# Patient Record
Sex: Female | Born: 1945
Health system: Southern US, Community
[De-identification: ages and names within clinical notes are randomized; demographics above are authoritative.]

## PROBLEM LIST (undated history)

## (undated) DIAGNOSIS — I10 Essential (primary) hypertension: Secondary | ICD-10-CM

## (undated) DIAGNOSIS — F419 Anxiety disorder, unspecified: Secondary | ICD-10-CM

## (undated) DIAGNOSIS — Z8719 Personal history of other diseases of the digestive system: Secondary | ICD-10-CM

## (undated) DIAGNOSIS — M719 Bursopathy, unspecified: Secondary | ICD-10-CM

## (undated) DIAGNOSIS — T7840XA Allergy, unspecified, initial encounter: Secondary | ICD-10-CM

## (undated) DIAGNOSIS — E7800 Pure hypercholesterolemia, unspecified: Secondary | ICD-10-CM

## (undated) DIAGNOSIS — H269 Unspecified cataract: Secondary | ICD-10-CM

## (undated) DIAGNOSIS — Z8619 Personal history of other infectious and parasitic diseases: Secondary | ICD-10-CM

## (undated) DIAGNOSIS — D649 Anemia, unspecified: Secondary | ICD-10-CM

## (undated) HISTORY — DX: Allergy, unspecified, initial encounter: T78.40XA

## (undated) HISTORY — DX: Personal history of other diseases of the digestive system: Z87.19

## (undated) HISTORY — DX: Bursopathy, unspecified: M71.9

## (undated) HISTORY — PX: CHOLECYSTECTOMY: SHX55

## (undated) HISTORY — PX: ABDOMINAL HYSTERECTOMY: SHX81

## (undated) HISTORY — DX: Unspecified cataract: H26.9

## (undated) HISTORY — PX: KIDNEY SURGERY: SHX687

## (undated) HISTORY — DX: Anemia, unspecified: D64.9

## (undated) HISTORY — PX: OTHER SURGICAL HISTORY: SHX169

## (undated) HISTORY — DX: Pure hypercholesterolemia, unspecified: E78.00

## (undated) HISTORY — PX: TONSILLECTOMY: SUR1361

## (undated) HISTORY — DX: Anxiety disorder, unspecified: F41.9

## (undated) HISTORY — DX: Personal history of other infectious and parasitic diseases: Z86.19

## (undated) HISTORY — DX: Essential (primary) hypertension: I10

---

## 2008-01-01 ENCOUNTER — Ambulatory Visit (HOSPITAL_BASED_OUTPATIENT_CLINIC_OR_DEPARTMENT_OTHER): Admission: RE | Admit: 2008-01-01 | Discharge: 2008-01-01 | Payer: Self-pay | Admitting: Orthopedic Surgery

## 2011-04-25 NOTE — Op Note (Signed)
[name], Kelli Wagner                ACCOUNT NO.:  [id number]   MEDICAL RECORD NO.:  [id number]          PATIENT TYPE:  AMB   LOCATION:  DSC                          FACILITY:  [facility]   PHYSICIAN:  Mila Homer. Sherlean Foot, M.D. DATE OF BIRTH:  12/13/1945   DATE OF PROCEDURE:  01/01/2008  DATE OF DISCHARGE:                               OPERATIVE REPORT   SURGEON:  Mila Homer. Sherlean Foot, M.D.   ASSISTANT:  None.   ANESTHESIA:  General.   PREOPERATIVE DIAGNOSIS:  Left shoulder impingement syndrome.   POSTOPERATIVE DIAGNOSIS:  Shoulder impingement syndrome plus  osteoarthritis of the acromioclavicular joint plus labral tearing.   PROCEDURE:  Left shoulder arthroscopy with debridement of an anterior  labral tear, subacromial decompression and distal clavicle resection.   INDICATIONS FOR PROCEDURE:  The patient is a 64 year old black female  with failure of conservative measures for shoulder impingement syndrome,  shoulder OA and MRI evidence of tendinosis of the rotator cuff, probable  anterior labral tear and AC joint degeneration.  Informed consent was  obtained.   DESCRIPTION OF PROCEDURE:  The patient was laid supine and administered  general anesthesia, then placed in a beach-chair position.  The left  shoulder was prepped and draped in the usual sterile fashion.  Inferolateral and inferomedial portals were created with a #11 blade,  blunt trocar and cannula.  Diagnostic arthroscopy revealed minimal  chondromalacia, but a degenerative-type anterior labral tear with  impingement in the joint.  The Gator shaver was used through the  anterior portal to debride the labrum.  I then went into the subacromial  space from the posterior portal.  I then went with the shaver from the  lateral portal and performed a bursectomy.  I then used the ArthroCare  debridement wand to clean off the undersurface of the acromion and  distal clavicle and released the CA ligament.  I then used a 4-mm  cylindrical  bur to perform aggressive acromioplasty and distal clavicle  resection.  I then debrided the rotator cuff from the top side and there  was some partial tearing indeed.  I then lavaged and closed with 4-0  nylon sutures, dressed with Xeroform dressing sponges, 2-inch silk tape  and a simple sling.   COMPLICATIONS:  None.   DRAINS:  None.           ______________________________  Mila Homer. Sherlean Foot, M.D.    SDL/MEDQ  D:  01/01/2008  T:  01/01/2008  Job:  [id number]

## 2011-09-01 LAB — POCT HEMOGLOBIN-HEMACUE: Hemoglobin: 14.7

## 2014-04-30 HISTORY — PX: COLONOSCOPY: SHX174

## 2017-04-19 DIAGNOSIS — Z9181 History of falling: Secondary | ICD-10-CM | POA: Diagnosis not present

## 2017-04-19 DIAGNOSIS — D509 Iron deficiency anemia, unspecified: Secondary | ICD-10-CM | POA: Diagnosis not present

## 2017-04-19 DIAGNOSIS — Z79899 Other long term (current) drug therapy: Secondary | ICD-10-CM | POA: Diagnosis not present

## 2017-04-19 DIAGNOSIS — M8580 Other specified disorders of bone density and structure, unspecified site: Secondary | ICD-10-CM | POA: Diagnosis not present

## 2017-04-19 DIAGNOSIS — M859 Disorder of bone density and structure, unspecified: Secondary | ICD-10-CM | POA: Diagnosis not present

## 2017-04-19 DIAGNOSIS — Z1389 Encounter for screening for other disorder: Secondary | ICD-10-CM | POA: Diagnosis not present

## 2017-04-19 DIAGNOSIS — Z0000 Encounter for general adult medical examination without abnormal findings: Secondary | ICD-10-CM | POA: Diagnosis not present

## 2017-04-19 DIAGNOSIS — R8299 Other abnormal findings in urine: Secondary | ICD-10-CM | POA: Diagnosis not present

## 2017-04-19 DIAGNOSIS — E782 Mixed hyperlipidemia: Secondary | ICD-10-CM | POA: Diagnosis not present

## 2017-04-19 DIAGNOSIS — Z13820 Encounter for screening for osteoporosis: Secondary | ICD-10-CM | POA: Diagnosis not present

## 2017-04-19 DIAGNOSIS — Z1231 Encounter for screening mammogram for malignant neoplasm of breast: Secondary | ICD-10-CM | POA: Diagnosis not present

## 2017-05-01 DIAGNOSIS — Z1231 Encounter for screening mammogram for malignant neoplasm of breast: Secondary | ICD-10-CM | POA: Diagnosis not present

## 2017-05-14 DIAGNOSIS — Z90710 Acquired absence of both cervix and uterus: Secondary | ICD-10-CM | POA: Diagnosis not present

## 2017-05-14 DIAGNOSIS — Z6835 Body mass index (BMI) 35.0-35.9, adult: Secondary | ICD-10-CM | POA: Diagnosis not present

## 2017-05-14 DIAGNOSIS — E669 Obesity, unspecified: Secondary | ICD-10-CM | POA: Diagnosis not present

## 2017-05-14 DIAGNOSIS — E785 Hyperlipidemia, unspecified: Secondary | ICD-10-CM | POA: Diagnosis not present

## 2017-05-18 DIAGNOSIS — N6489 Other specified disorders of breast: Secondary | ICD-10-CM | POA: Diagnosis not present

## 2017-05-18 DIAGNOSIS — R922 Inconclusive mammogram: Secondary | ICD-10-CM | POA: Diagnosis not present

## 2017-05-18 DIAGNOSIS — R928 Other abnormal and inconclusive findings on diagnostic imaging of breast: Secondary | ICD-10-CM | POA: Diagnosis not present

## 2017-07-18 DIAGNOSIS — K091 Developmental (nonodontogenic) cysts of oral region: Secondary | ICD-10-CM | POA: Diagnosis not present

## 2017-09-28 DIAGNOSIS — Z23 Encounter for immunization: Secondary | ICD-10-CM | POA: Diagnosis not present

## 2017-09-28 DIAGNOSIS — R0789 Other chest pain: Secondary | ICD-10-CM | POA: Diagnosis not present

## 2017-09-28 DIAGNOSIS — Z6833 Body mass index (BMI) 33.0-33.9, adult: Secondary | ICD-10-CM | POA: Diagnosis not present

## 2017-09-28 DIAGNOSIS — Z1339 Encounter for screening examination for other mental health and behavioral disorders: Secondary | ICD-10-CM | POA: Diagnosis not present

## 2017-09-28 DIAGNOSIS — E663 Overweight: Secondary | ICD-10-CM | POA: Diagnosis not present

## 2017-10-03 DIAGNOSIS — R0789 Other chest pain: Secondary | ICD-10-CM | POA: Diagnosis not present

## 2018-06-24 DIAGNOSIS — Z1339 Encounter for screening examination for other mental health and behavioral disorders: Secondary | ICD-10-CM | POA: Diagnosis not present

## 2018-06-24 DIAGNOSIS — Z0000 Encounter for general adult medical examination without abnormal findings: Secondary | ICD-10-CM | POA: Diagnosis not present

## 2018-06-24 DIAGNOSIS — M7742 Metatarsalgia, left foot: Secondary | ICD-10-CM | POA: Diagnosis not present

## 2018-06-24 DIAGNOSIS — J309 Allergic rhinitis, unspecified: Secondary | ICD-10-CM | POA: Diagnosis not present

## 2018-06-24 DIAGNOSIS — Z6832 Body mass index (BMI) 32.0-32.9, adult: Secondary | ICD-10-CM | POA: Diagnosis not present

## 2018-06-24 DIAGNOSIS — Z1331 Encounter for screening for depression: Secondary | ICD-10-CM | POA: Diagnosis not present

## 2018-07-01 DIAGNOSIS — Z6832 Body mass index (BMI) 32.0-32.9, adult: Secondary | ICD-10-CM | POA: Diagnosis not present

## 2018-07-01 DIAGNOSIS — J309 Allergic rhinitis, unspecified: Secondary | ICD-10-CM | POA: Diagnosis not present

## 2018-08-01 DIAGNOSIS — M2021 Hallux rigidus, right foot: Secondary | ICD-10-CM | POA: Insufficient documentation

## 2018-11-12 ENCOUNTER — Encounter: Payer: Self-pay | Admitting: Gastroenterology

## 2018-11-20 ENCOUNTER — Encounter: Payer: Self-pay | Admitting: Gastroenterology

## 2018-11-22 ENCOUNTER — Encounter: Payer: Self-pay | Admitting: Gastroenterology

## 2018-11-22 ENCOUNTER — Ambulatory Visit: Payer: Self-pay | Admitting: Gastroenterology

## 2018-11-22 ENCOUNTER — Other Ambulatory Visit (INDEPENDENT_AMBULATORY_CARE_PROVIDER_SITE_OTHER): Payer: Medicare Other

## 2018-11-22 ENCOUNTER — Encounter (INDEPENDENT_AMBULATORY_CARE_PROVIDER_SITE_OTHER): Payer: Self-pay

## 2018-11-22 ENCOUNTER — Ambulatory Visit (INDEPENDENT_AMBULATORY_CARE_PROVIDER_SITE_OTHER): Payer: Medicare Other | Admitting: Gastroenterology

## 2018-11-22 VITALS — BP 132/68 | HR 66 | Ht 60.0 in | Wt 174.0 lb

## 2018-11-22 DIAGNOSIS — Z8719 Personal history of other diseases of the digestive system: Secondary | ICD-10-CM

## 2018-11-22 DIAGNOSIS — R1032 Left lower quadrant pain: Secondary | ICD-10-CM | POA: Diagnosis not present

## 2018-11-22 MED ORDER — DICYCLOMINE HCL 10 MG PO CAPS: 10.0000 mg | ORAL_CAPSULE | Freq: Two times a day (BID) | ORAL | 11 refills | Status: DC

## 2018-11-22 NOTE — Patient Instructions (Signed)
If you are age 71 or older, your body mass index should be between 23-30. Your Body mass index is 33.98 kg/m. If this is out of the aforementioned range listed, please consider follow up with your Primary Care Provider.  If you are age 76 or younger, your body mass index should be between 19-25. Your Body mass index is 33.98 kg/m. If this is out of the aformentioned range listed, please consider follow up with your Primary Care Provider.   We have sent the following medications to your pharmacy for you to pick up at your convenience: Bentyl  You have been scheduled for a CT scan of the abdomen and pelvis at [facility] are scheduled on 12/06/18 at [place] should arrive 15 minutes prior to your appointment time for registration. Please follow the written instructions below on the day of your exam:  WARNING: IF YOU ARE ALLERGIC TO IODINE/X-RAY DYE, PLEASE NOTIFY RADIOLOGY IMMEDIATELY AT [phone]! YOU WILL BE GIVEN A 13 HOUR PREMEDICATION PREP.  1) Do not eat or drink anything after 10am (4 hours prior to your test) 2) You have been given 2 bottles of oral contrast to drink. The solution may taste better if refrigerated, but do NOT add ice or any other liquid to this solution. Shake well before drinking.    Drink 1 bottle of contrast @ 8am (2 hours prior to your exam)  Drink 1 bottle of contrast @ 9am (1 hour prior to your exam)  You may take any medications as prescribed with a small amount of water, if necessary. If you take any of the following medications: METFORMIN, GLUCOPHAGE, GLUCOVANCE, AVANDAMET, RIOMET, FORTAMET, [place] MET, JANUMET, GLUMETZA or METAGLIP, you MAY be asked to HOLD this medication 48 hours AFTER the exam.  The purpose of you drinking the oral contrast is to aid in the visualization of your intestinal tract. The contrast solution may cause some diarrhea. Depending on your individual set of symptoms, you may also receive an intravenous injection of  x-ray contrast/dye. Plan on being at [facility] for 30 minutes or longer, depending on the type of exam you are having performed.  This test typically takes 30-45 minutes to complete.  If you have any questions regarding your exam or if you need to reschedule, you may call the CT department at [phone] between the hours of 8:00 am and 5:00 pm, Monday-Friday.  ________________________________________________________________________  Dennis Bast will be due for a recall colonoscopy in 04/2019. We will send you a reminder in the mail when it gets closer to that time.   Please go to the lab on the 2nd floor [address] before you leave the office today.    Thank you,  Dr. Jackquline Denmark

## 2018-11-22 NOTE — Progress Notes (Signed)
Chief Complaint: Abdominal pain  Referring Provider:  Myer Peer, MD      ASSESSMENT AND PLAN;   #1.  Episodic lower abdominal pain resolved after N/V.  #2. H/O diverticulitis s/p lap sigmoid resection 02/2011  #3.  History of colonic polyps (TA) 04/2014.  Plan: - Bentyl 10mg  po bid #30 - CT abdo/pelvis with p.o. and IV contrast. - Increase water intake - CBC, CMP, lipase and TSH today. - If still with problems, will consider EGD/colon. - Rpt routine colon 04/2019. Earlier, if still with problems.  HPI:    Kelli Wagner is a 72 y.o. female  Referred by Dr. Nicki Reaper With episodic lower abdominal crampy pain x 2 months Had 4 discrete episodes-first 1 after eating apple, second after herbal medicines, 3rd occurred after eating baked chicken and rice/gravy, 4th happened spontaneously All these episodes got better after she had nausea/vomiting He denies having any abdominal bloating, heartburn, regurgitation, odynophagia or dysphagia. No fever or chills She feels better in between the episodes No weight loss No significant diarrhea or constipation Occasionally does have pellet-like stools. No melena or hematochezia. Accompanied by her husband Past Medical History:  Diagnosis Date  . History of diverticulitis    with hemorrhage (recurrent)   . Hx of Clostridium difficile infection   . Hypercholesteremia     Past Surgical History:  Procedure Laterality Date  . ABDOMINAL HYSTERECTOMY    . CHOLECYSTECTOMY    . COLONOSCOPY  04/30/2014   Colonic polyps status post polypectomy. Otherwise normal postoperative colonoscopy.   Marland Kitchen KIDNEY SURGERY    . Laproscopic sigmoid colon resection with primary anastomosis    . TONSILLECTOMY      Family History  Problem Relation Age of Onset  . Colon cancer Neg Hx     Social History   Tobacco Use  . Smoking status: Never Smoker  . Smokeless tobacco: Never Used  Substance Use Topics  . Alcohol use: Never    Frequency: Never  .  Drug use: Not on file    Current Outpatient Medications  Medication Sig Dispense Refill  . aspirin 81 MG chewable tablet Chew by mouth daily.    Marland Kitchen azelastine (ASTELIN) 0.1 % nasal spray Place into both nostrils 2 (two) times daily. Use in each nostril as directed    . calcium-vitamin D (OSCAL WITH D) 500-200 MG-UNIT tablet Take 1 tablet by mouth.    . cholecalciferol (VITAMIN D3) 25 MCG (1000 UT) tablet Take 2,000 Units by mouth daily.    . ferrous sulfate 325 (65 FE) MG tablet Take 325 mg by mouth daily with breakfast.    . fexofenadine (ALLEGRA) 180 MG tablet Take 180 mg by mouth daily.    . Multiple Vitamins-Minerals (WOMENS 50+ MULTI VITAMIN/MIN PO) Take by mouth.    . omega-3 acid ethyl esters (LOVAZA) 1 g capsule Take by mouth 2 (two) times daily.    . vitamin C (ASCORBIC ACID) 500 MG tablet Take 500 mg by mouth daily.     No current facility-administered medications for this visit.     Not on File  Review of Systems:  Constitutional: Denies fever, chills, diaphoresis, appetite change and fatigue.  HEENT: Denies photophobia, eye pain, redness, hearing loss, ear pain, congestion, sore throat, rhinorrhea, sneezing, mouth sores, neck pain, neck stiffness and tinnitus.   Respiratory: Denies SOB, DOE, cough, chest tightness,  and wheezing.   Cardiovascular: Denies chest pain, palpitations and leg swelling.  Genitourinary: Denies dysuria, urgency, frequency, hematuria, flank pain  and difficulty urinating.  Musculoskeletal: Denies myalgias, back pain, joint swelling, arthralgias and gait problem.  Skin: No rash.  Neurological: Denies dizziness, seizures, syncope, weakness, light-headedness, numbness and headaches.  Hematological: Denies adenopathy. Easy bruising, personal or family bleeding history  Psychiatric/Behavioral: No anxiety or depression     Physical Exam:    BP 132/68   Pulse 66   Ht 5' (1.524 m)   Wt 174 lb (78.9 kg)   BMI 33.98 kg/m  Filed Weights   11/22/18  1348  Weight: 174 lb (78.9 kg)   Constitutional:  Well-developed, in no acute distress. Psychiatric: Normal mood and affect. Behavior is normal. HEENT: Pupils normal.  Conjunctivae are normal. No scleral icterus. Neck supple.  Cardiovascular: Normal rate, regular rhythm. No edema Pulmonary/chest: Effort normal and breath sounds normal. No wheezing, rales or rhonchi. Abdominal: Soft, nondistended.  Mild lower abdominal tenderness, no rebound, bowel sounds active throughout. There are no masses palpable. No hepatomegaly.  Rectal:  defered Neurological: Alert and oriented to person place and time. Skin: Skin is warm and dry. No rashes noted.    Carmell Austria, MD 11/22/2018, 2:02 PM  Cc: Myer Peer, MD

## 2018-11-23 LAB — CBC WITH DIFFERENTIAL/PLATELET
BASOS ABS: 19 {cells}/uL (ref 0–200)
Basophils Relative: 0.3 %
EOS PCT: 2.1 %
Eosinophils Absolute: 132 cells/uL (ref 15–500)
HCT: 40.7 % (ref 35.0–45.0)
Hemoglobin: 13.7 g/dL (ref 11.7–15.5)
Lymphs Abs: 1657 cells/uL (ref 850–3900)
MCH: 29.6 pg (ref 27.0–33.0)
MCHC: 33.7 g/dL (ref 32.0–36.0)
MCV: 87.9 fL (ref 80.0–100.0)
MONOS PCT: 11.1 %
MPV: 10.2 fL (ref 7.5–12.5)
NEUTROS PCT: 60.2 %
Neutro Abs: 3793 cells/uL (ref 1500–7800)
PLATELETS: 270 10*3/uL (ref 140–400)
RBC: 4.63 10*6/uL (ref 3.80–5.10)
RDW: 13.5 % (ref 11.0–15.0)
Total Lymphocyte: 26.3 %
WBC mixed population: 699 cells/uL (ref 200–950)
WBC: 6.3 10*3/uL (ref 3.8–10.8)

## 2018-11-23 LAB — COMPREHENSIVE METABOLIC PANEL
AG Ratio: 2.1 (calc) (ref 1.0–2.5)
ALT: 23 U/L (ref 6–29)
AST: 21 U/L (ref 10–35)
Albumin: 4.6 g/dL (ref 3.6–5.1)
Alkaline phosphatase (APISO): 72 U/L (ref 33–130)
BILIRUBIN TOTAL: 0.3 mg/dL (ref 0.2–1.2)
BUN: 18 mg/dL (ref 7–25)
CALCIUM: 10.1 mg/dL (ref 8.6–10.4)
CO2: 29 mmol/L (ref 20–32)
Chloride: 105 mmol/L (ref 98–110)
Creat: 0.77 mg/dL (ref 0.60–0.93)
Globulin: 2.2 g/dL (calc) (ref 1.9–3.7)
Glucose, Bld: 100 mg/dL — ABNORMAL HIGH (ref 65–99)
Potassium: 4.1 mmol/L (ref 3.5–5.3)
Sodium: 143 mmol/L (ref 135–146)
TOTAL PROTEIN: 6.8 g/dL (ref 6.1–8.1)

## 2018-11-23 LAB — TSH: TSH: 1.65 m[IU]/L (ref 0.40–4.50)

## 2018-11-23 LAB — LIPASE: LIPASE: 48 U/L (ref 7–60)

## 2018-12-06 ENCOUNTER — Ambulatory Visit (HOSPITAL_BASED_OUTPATIENT_CLINIC_OR_DEPARTMENT_OTHER): Admission: RE | Admit: 2018-12-06 | Payer: Medicare Other | Source: Ambulatory Visit

## 2018-12-24 ENCOUNTER — Telehealth: Payer: Self-pay | Admitting: Gastroenterology

## 2018-12-24 NOTE — Telephone Encounter (Signed)
Called and left a message on VM for patient -RN explained that the CT scan scheduled for tomorrow would be cancelled as requested by the patient-patient was then informed (via VM) that she would need to call back to the office to reschedule the CT as soon as possible, office number given [phone];

## 2018-12-25 ENCOUNTER — Ambulatory Visit (HOSPITAL_BASED_OUTPATIENT_CLINIC_OR_DEPARTMENT_OTHER): Admission: RE | Admit: 2018-12-25 | Payer: Medicare Other | Source: Ambulatory Visit

## 2019-01-03 ENCOUNTER — Encounter (HOSPITAL_BASED_OUTPATIENT_CLINIC_OR_DEPARTMENT_OTHER): Payer: Self-pay

## 2019-01-03 ENCOUNTER — Ambulatory Visit (HOSPITAL_BASED_OUTPATIENT_CLINIC_OR_DEPARTMENT_OTHER)
Admission: RE | Admit: 2019-01-03 | Discharge: 2019-01-03 | Disposition: A | Discharge status: Home / Self Care | Payer: Medicare Other | Source: Ambulatory Visit | Attending: Gastroenterology | Admitting: Gastroenterology

## 2019-01-03 DIAGNOSIS — R1032 Left lower quadrant pain: Secondary | ICD-10-CM | POA: Insufficient documentation

## 2019-01-03 DIAGNOSIS — Z8719 Personal history of other diseases of the digestive system: Secondary | ICD-10-CM | POA: Diagnosis present

## 2019-01-03 MED ORDER — IOPAMIDOL (ISOVUE-300) INJECTION 61%
100.0000 mL | Freq: Once | INTRAVENOUS | Status: AC | PRN
  Administered 2019-01-03: 100 mL via INTRAVENOUS

## 2019-05-02 ENCOUNTER — Ambulatory Visit (AMBULATORY_SURGERY_CENTER): Payer: Self-pay | Admitting: *Deleted

## 2019-05-02 ENCOUNTER — Other Ambulatory Visit: Payer: Self-pay

## 2019-05-02 VITALS — Ht 60.25 in | Wt 150.0 lb

## 2019-05-02 DIAGNOSIS — Z86010 Personal history of colonic polyps: Secondary | ICD-10-CM

## 2019-05-02 DIAGNOSIS — R1032 Left lower quadrant pain: Secondary | ICD-10-CM

## 2019-05-02 MED ORDER — NA SULFATE-K SULFATE-MG SULF 17.5-3.13-1.6 GM/177ML PO SOLN: 1.0000 | Freq: Once | ORAL | 0 refills | Status: AC

## 2019-05-02 NOTE — Progress Notes (Signed)
No egg or soy allergy known to patient  No issues with past sedation with any surgeries  or procedures, no intubation problems - with gall bladder hypotension due to increased sedation  No diet pills per patient No home 02 use per patient  No blood thinners per patient  Pt denies issues with constipation  No A fib or A flutter  EMMI video sent to pt's e mail    Pt mailed instruction packet to included paper to complete and mail back to [facility] with addressed and stamped envelope, Emmi video, copy of consent form to read and not return, and instructions. PV completed over the phone. Pt encouraged to call with questions or issues

## 2019-05-14 ENCOUNTER — Telehealth: Payer: Self-pay | Admitting: *Deleted

## 2019-05-14 NOTE — Telephone Encounter (Signed)

## 2019-05-16 ENCOUNTER — Telehealth: Payer: Self-pay | Admitting: Gastroenterology

## 2019-05-16 ENCOUNTER — Encounter: Payer: Medicare Other | Admitting: Gastroenterology

## 2019-05-16 NOTE — Telephone Encounter (Signed)
Discussed with Dr. Lyndel Safe. Offered endo today or reschedule ECL for another date. Rescheduled pt to 05/22/19 at 100 pm, reviewed instructions with patient, states understanding.

## 2019-05-20 ENCOUNTER — Telehealth: Payer: Self-pay | Admitting: *Deleted

## 2019-05-20 NOTE — Telephone Encounter (Signed)

## 2019-05-22 ENCOUNTER — Other Ambulatory Visit: Payer: Self-pay

## 2019-05-22 ENCOUNTER — Ambulatory Visit (AMBULATORY_SURGERY_CENTER): Payer: Medicare Other | Admitting: Gastroenterology

## 2019-05-22 ENCOUNTER — Encounter: Payer: Self-pay | Admitting: Gastroenterology

## 2019-05-22 VITALS — BP 118/68 | HR 69 | Temp 98.5°F | Resp 13 | Ht 60.0 in | Wt 174.0 lb

## 2019-05-22 DIAGNOSIS — K449 Diaphragmatic hernia without obstruction or gangrene: Secondary | ICD-10-CM

## 2019-05-22 DIAGNOSIS — Z86010 Personal history of colonic polyps: Secondary | ICD-10-CM

## 2019-05-22 DIAGNOSIS — D124 Benign neoplasm of descending colon: Secondary | ICD-10-CM

## 2019-05-22 DIAGNOSIS — K2970 Gastritis, unspecified, without bleeding: Secondary | ICD-10-CM | POA: Diagnosis not present

## 2019-05-22 MED ORDER — OMEPRAZOLE 20 MG PO CPDR: 20.0000 mg | DELAYED_RELEASE_CAPSULE | Freq: Every day | ORAL | 0 refills | Status: DC

## 2019-05-22 MED ORDER — SODIUM CHLORIDE 0.9 % IV SOLN: 500.0000 mL | INTRAVENOUS | Status: DC

## 2019-05-22 NOTE — Progress Notes (Signed)
Report to PACU, RN, vss, BBS= Clear.  

## 2019-05-22 NOTE — Op Note (Signed)
[facility] Patient Name: Kelli Wagner Procedure Date: 05/22/2019 12:15 PM MRN: [id number] Endoscopist: Jackquline Denmark , MD Age: 73 Referring MD:  Date of Birth: 08/11/1946 Gender: Female Account #: [id number] Procedure:                Upper GI endoscopy Indications:              Epigastric abdominal pain Medicines:                Monitored Anesthesia Care Procedure:                Pre-Anesthesia Assessment:                           - Prior to the procedure, a History and Physical                            was performed, and patient medications and                            allergies were reviewed. The patient's tolerance of                            previous anesthesia was also reviewed. The risks                            and benefits of the procedure and the sedation                            options and risks were discussed with the patient.                            All questions were answered, and informed consent                            was obtained. Prior Anticoagulants: The patient has                            taken no previous anticoagulant or antiplatelet                            agents. ASA Grade Assessment: II - A patient with                            mild systemic disease. After reviewing the risks                            and benefits, the patient was deemed in                            satisfactory condition to undergo the procedure.                           - Prior to the procedure, a History and Physical  was performed, and patient medications and                            allergies were reviewed. The patient's tolerance of                            previous anesthesia was also reviewed. The risks                            and benefits of the procedure and the sedation                            options and risks were discussed with the patient.                            All questions were answered, and  informed consent                            was obtained. Prior Anticoagulants: The patient has                            taken no previous anticoagulant or antiplatelet                            agents. ASA Grade Assessment: II - A patient with                            mild systemic disease. After reviewing the risks                            and benefits, the patient was deemed in                            satisfactory condition to undergo the procedure.                           After obtaining informed consent, the endoscope was                            passed under direct vision. Throughout the                            procedure, the patient's blood pressure, pulse, and                            oxygen saturations were monitored continuously. The                            Endoscope was introduced through the mouth, and                            advanced to the second part of duodenum. The upper  GI endoscopy was accomplished without difficulty.                            The patient tolerated the procedure well. Scope In: Scope Out: Findings:                 The examined esophagus was normal.                           The Z-line was regular and was found 35 cm from the                            incisors.                           A small transient hiatal hernia was present.                           Localized mild inflammation characterized by                            erythema was found in the gastric antrum. Biopsies                            were taken with a cold forceps for histology.                            Estimated blood loss: none.                           There was a small incidental lipoma, 10 mm in                            diameter, at the incisura.                           The examined duodenum was normal. Biopsies for                            histology were taken with a cold forceps for                             evaluation of celiac disease. Estimated blood loss:                            none. Complications:            No immediate complications. Estimated Blood Loss:     Estimated blood loss: none. Impression:               -Small transient hiatal hernia.                           -Mild gastritis.                           -Otherwise normal EGD. Recommendation:           -  Patient has a contact number available for                            emergencies. The signs and symptoms of potential                            delayed complications were discussed with the                            patient. Return to normal activities tomorrow.                            Written discharge instructions were provided to the                            patient.                           - Resume previous diet.                           - Omeprazole 20 mg p.o. once a day for 4 weeks.                           - Await pathology results.                           - Return to GI clinic in 12 weeks. Earlier, if                            still with problems. Jackquline Denmark, MD 05/22/2019 1:39:39 PM This report has been signed electronically.

## 2019-05-22 NOTE — Patient Instructions (Signed)
Handouts given for Gastritis, Hiatal Hernia, polyps, diverticulosis and hemorrhoids.  Rx for Omeprazole (Prilosec) 20mg  daily by mouth for one month.  YOU HAD AN ENDOSCOPIC PROCEDURE TODAY AT [facility]:   Refer to the procedure report that was given to you for any specific questions about what was found during the examination.  If the procedure report does not answer your questions, please call your gastroenterologist to clarify.  If you requested that your care partner not be given the details of your procedure findings, then the procedure report has been included in a sealed envelope for you to review at your convenience later.  YOU SHOULD EXPECT: Some feelings of bloating in the abdomen. Passage of more gas than usual.  Walking can help get rid of the air that was put into your GI tract during the procedure and reduce the bloating. If you had a lower endoscopy (such as a colonoscopy or flexible sigmoidoscopy) you may notice spotting of blood in your stool or on the toilet paper. If you underwent a bowel prep for your procedure, you may not have a normal bowel movement for a few days.  Please Note:  You might notice some irritation and congestion in your nose or some drainage.  This is from the oxygen used during your procedure.  There is no need for concern and it should clear up in a day or so.  SYMPTOMS TO REPORT IMMEDIATELY:   Following lower endoscopy (colonoscopy or flexible sigmoidoscopy):  Excessive amounts of blood in the stool  Significant tenderness or worsening of abdominal pains  Swelling of the abdomen that is new, acute  Fever of 100F or higher   Following upper endoscopy (EGD)  Vomiting of blood or coffee ground material  New chest pain or pain under the shoulder blades  Painful or persistently difficult swallowing  New shortness of breath  Fever of 100F or higher  Black, tarry-looking stools  For urgent or emergent issues, a gastroenterologist can  be reached at any hour by calling [phone].   DIET:  We do recommend a small meal at first, but then you may proceed to your regular diet.  Drink plenty of fluids but you should avoid alcoholic beverages for 24 hours.  ACTIVITY:  You should plan to take it easy for the rest of today and you should NOT DRIVE or use heavy machinery until tomorrow (because of the sedation medicines used during the test).    FOLLOW UP: Our staff will call the number listed on your records 48-72 hours following your procedure to check on you and address any questions or concerns that you may have regarding the information given to you following your procedure. If we do not reach you, we will leave a message.  We will attempt to reach you two times.  During this call, we will ask if you have developed any symptoms of COVID 19. If you develop any symptoms (ie: fever, flu-like symptoms, shortness of breath, cough etc.) before then, please call [phone].  If you test positive for Covid 19 in the 2 weeks post procedure, please call and report this information to [place].    If any biopsies were taken you will be contacted by phone or by letter within the next 1-3 weeks.  Please call us at [phone] if you have not heard about the biopsies in 3 weeks.    SIGNATURES/CONFIDENTIALITY: You and/or your care partner have signed paperwork which will be entered into your electronic medical  record.  These signatures attest to the fact that that the information above on your After Visit Summary has been reviewed and is understood.  Full responsibility of the confidentiality of this discharge information lies with you and/or your care-partner. 

## 2019-05-22 NOTE — Op Note (Signed)
Kelli Wagner: Kelli Wagner Procedure Date: 05/22/2019 12:15 PM MRN: [id number] Endoscopist: Jackquline Denmark , MD Age: 73 Referring MD:  Date of Birth: 03/19/1946 Gender: Female Account #: [id number] Procedure:                Colonoscopy Indications:              High risk colon cancer surveillance: Personal                            history of colonic polyps Medicines:                Monitored Anesthesia Care Procedure:                Pre-Anesthesia Assessment:                           - Prior to the procedure, a History and Physical                            was performed, and patient medications and                            allergies were reviewed. The patient's tolerance of                            previous anesthesia was also reviewed. The risks                            and benefits of the procedure and the sedation                            options and risks were discussed with the patient.                            All questions were answered, and informed consent                            was obtained. Prior Anticoagulants: The patient has                            taken no previous anticoagulant or antiplatelet                            agents. ASA Grade Assessment: II - A patient with                            mild systemic disease. After reviewing the risks                            and benefits, the patient was deemed in                            satisfactory condition to undergo the procedure.  After obtaining informed consent, the colonoscope                            was passed under direct vision. Throughout the                            procedure, the patient's blood pressure, pulse, and                            oxygen saturations were monitored continuously. The                            Colonoscope was introduced through the anus and                            advanced to the the cecum, identified by                             appendiceal orifice and ileocecal valve. The                            colonoscopy was performed without difficulty. The                            patient tolerated the procedure well. The quality                            of the bowel preparation was excellent. The                            ileocecal valve, appendiceal orifice, and rectum                            were photographed. Scope In: 1:18:46 PM Scope Out: 1:27:51 PM Scope Withdrawal Time: 0 hours 7 minutes 35 seconds  Total Procedure Duration: 0 hours 9 minutes 5 seconds  Findings:                 A 4 mm polyp was found in the mid descending colon.                            The polyp was sessile. The polyp was removed with a                            cold biopsy forceps. Resection and retrieval were                            complete. Estimated blood loss: none.                           A single small-mouthed diverticulum was found in                            the neo-sigmoid colon, just distal to the  anastomosis.                           There was evidence of a prior end-to-end                            colo-colonic anastomosis in the sigmoid colon, 20                            cm from the anal verge.. This was patent. The                            anastomosis was traversed.                           Non-bleeding internal hemorrhoids were found during                            retroflexion and during perianal exam. The                            hemorrhoids were small.                           The exam was otherwise without abnormality on                            direct and retroflexion views. Complications:            No immediate complications. Estimated Blood Loss:     Estimated blood loss: none. Impression:               -Diminutive colonic polyp status post polypectomy.                           -Minimal neo-sigmoid diverticulosis.                            -Small internal hemorrhoids.                           -Otherwise normal colonoscopy. S/P sigmoid                            resection. Recommendation:           - Patient has a contact number available for                            emergencies. The signs and symptoms of potential                            delayed complications were discussed with the                            patient. Return to normal activities tomorrow.  Written discharge instructions were provided to the                            patient.                           - Resume previous diet.                           - Continue present medications.                           - Await pathology results.                           - Repeat colonoscopy for surveillance based on                            pathology results.                           - Return to GI clinic PRN. Jackquline Denmark, MD 05/22/2019 1:35:18 PM This report has been signed electronically.

## 2019-05-23 ENCOUNTER — Telehealth: Payer: Self-pay | Admitting: Gastroenterology

## 2019-05-23 NOTE — Telephone Encounter (Signed)
Patient called would like to know if she should continue to take dicyclomine (BENTYL) 10 MG capsule

## 2019-05-23 NOTE — Telephone Encounter (Signed)
Please advise 

## 2019-05-26 ENCOUNTER — Telehealth: Payer: Self-pay

## 2019-05-26 NOTE — Telephone Encounter (Signed)
  Follow up Call-  Call back number 05/22/2019  Post procedure Call Back phone  # [phone]  Permission to leave phone message Yes  Some recent data might be hidden     Patient questions:  Do you have a fever, pain , or abdominal swelling? No. Pain Score  0 *  Have you tolerated food without any problems? Yes.    Have you been able to return to your normal activities? Yes.    Do you have any questions about your discharge instructions: Diet   No. Medications  No. Follow up visit  No.  Do you have questions or concerns about your Care? No.  Actions: * If pain score is 4 or above: 1. No action needed, pain <4.Have you developed a fever since your procedure? no  2.   Have you had an respiratory symptoms (SOB or cough) since your procedure? no  3.   Have you tested positive for COVID 19 since your procedure no  4.   Have you had any family members/close contacts diagnosed with the COVID 19 since your procedure?  no   If yes to any of these questions please route to Joylene John, RN and Alphonsa Gin, Therapist, sports.

## 2019-05-27 NOTE — Telephone Encounter (Signed)
I have let patient know, patient said she doesn't need any refills at this time.

## 2019-05-27 NOTE — Telephone Encounter (Signed)
She can continue taking Bentyl 10 mg p.o. twice daily if it is helping.  Please make sure she has enough. Thx  RG

## 2019-06-09 ENCOUNTER — Encounter: Payer: Self-pay | Admitting: Gastroenterology

## 2019-06-17 ENCOUNTER — Telehealth: Payer: Self-pay | Admitting: Gastroenterology

## 2019-06-17 DIAGNOSIS — K594 Anal spasm: Secondary | ICD-10-CM

## 2019-06-17 DIAGNOSIS — Z8719 Personal history of other diseases of the digestive system: Secondary | ICD-10-CM

## 2019-06-17 MED ORDER — DICYCLOMINE HCL 10 MG PO CAPS: 10.0000 mg | ORAL_CAPSULE | Freq: Two times a day (BID) | ORAL | 11 refills | Status: DC

## 2019-06-17 NOTE — Telephone Encounter (Signed)
See previous charting for documentation- Called and spoke with patient and verified pharmacy to which Rx has been sent-patient aware of Rx being sent and verbalized understanding of information/instructions; Patient advised to call back to the office should questions/concerns arise;

## 2019-06-17 NOTE — Telephone Encounter (Signed)
Continue bentyl 10mg  po bid (60), 11 refills Thx RG

## 2019-06-17 NOTE — Telephone Encounter (Signed)
Please review previous message and please advise Last note states if medication is working to continue-at that time patient did not require a refill

## 2019-07-19 IMAGING — CT CT ABD-PELV W/ CM
2 of 5 series · 15 of 46 positions shown, 17 images · IV contrast (APPLIED)
Comparison: CT scan 10/18/2010

CLINICAL DATA: Intermittent chronic left lower quadrant abdominal
pain. History of prior laparoscopic sigmoid colon resection and
primary anastomosis.

EXAM:
CT ABDOMEN AND PELVIS WITH CONTRAST
TECHNIQUE: Multidetector CT imaging of the abdomen and pelvis was performed
using the standard protocol following bolus administration of
intravenous contrast.
CONTRAST:  100mL IU8FSG-FSS IOPAMIDOL (IU8FSG-FSS) INJECTION 61%

[Series 2: axial st · axial · 0.69mm/px · z∈[-380,+20]mm · 12 of 91 slices shown, 14 images]
[im 6/91  soft-tissue]
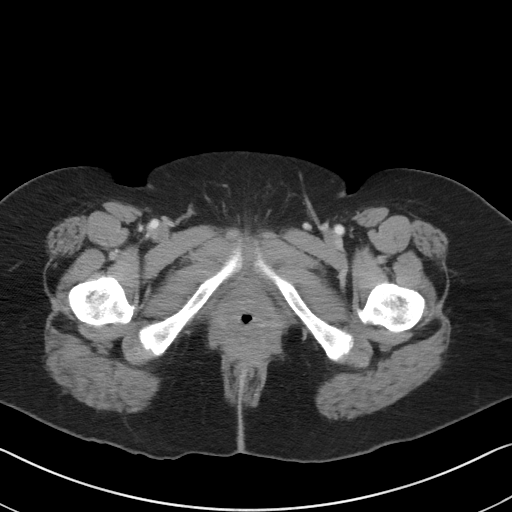
[im 6/91  bone]
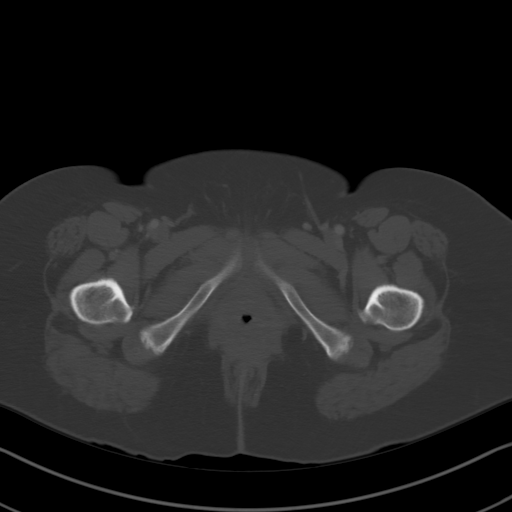
[im 16/91  soft-tissue]
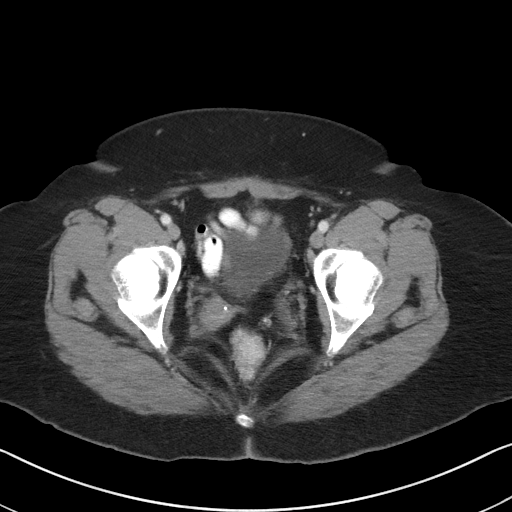
[im 21/91  soft-tissue]
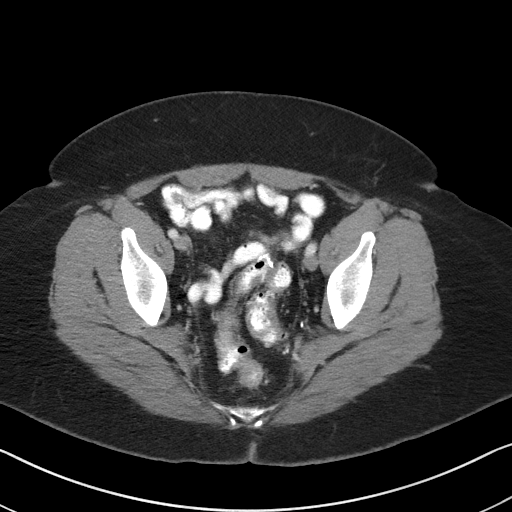
[im 26/91  soft-tissue]
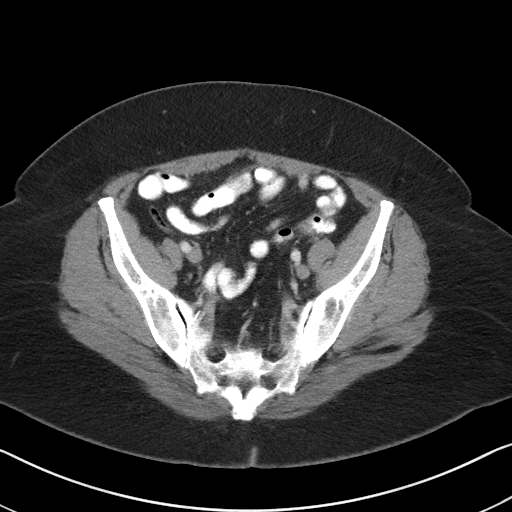
[im 36/91  soft-tissue]
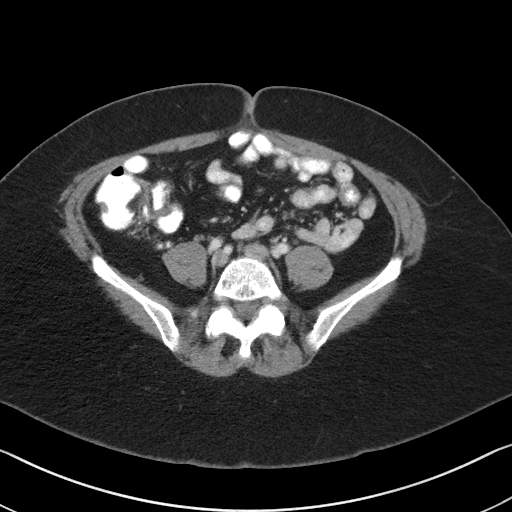
[im 41/91  soft-tissue]
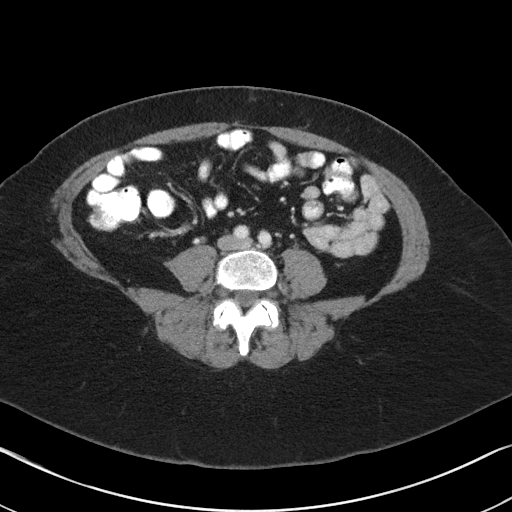
[im 51/91  soft-tissue]
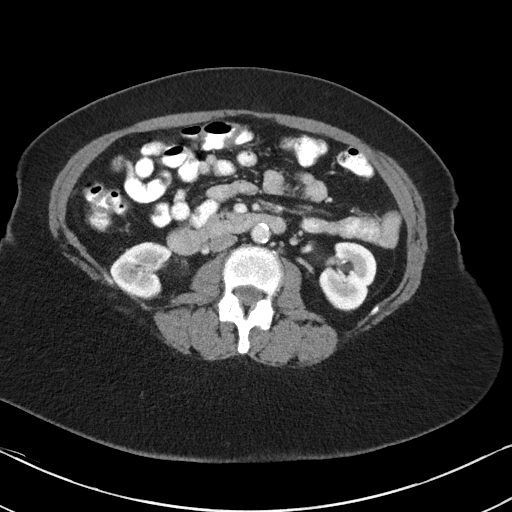
[im 56/91  soft-tissue]
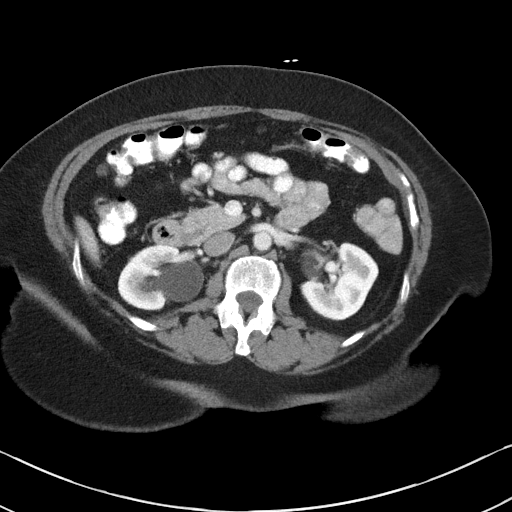
[im 66/91  soft-tissue]
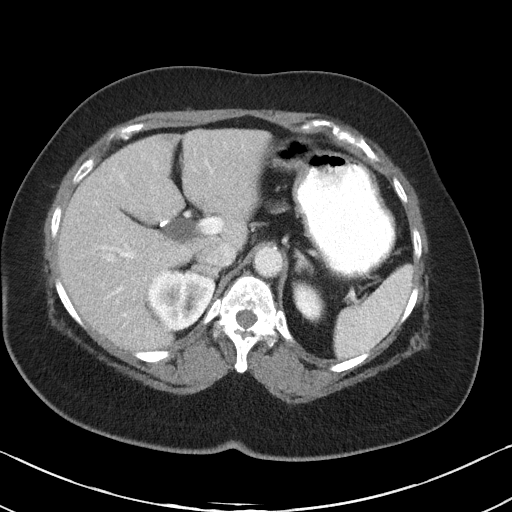
[im 66/91  bone]
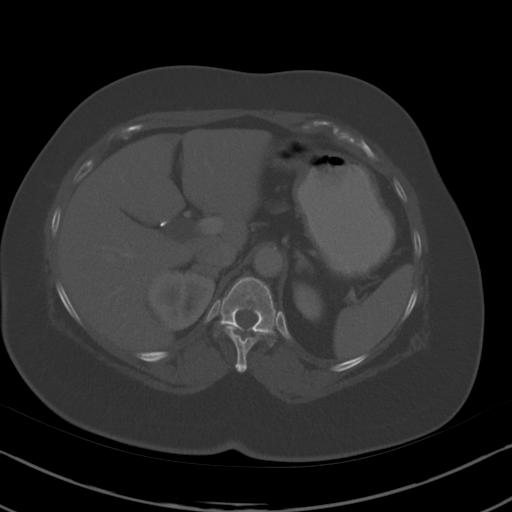
[im 71/91  soft-tissue]
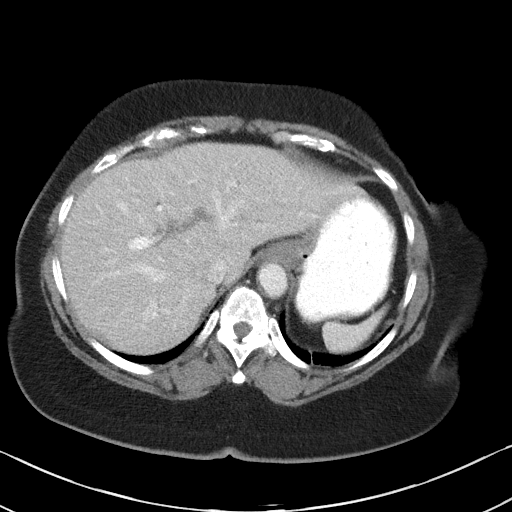
[im 76/91  soft-tissue]
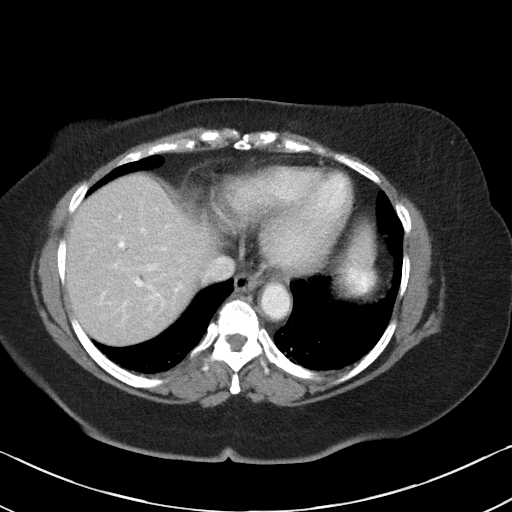
[im 86/91  soft-tissue]
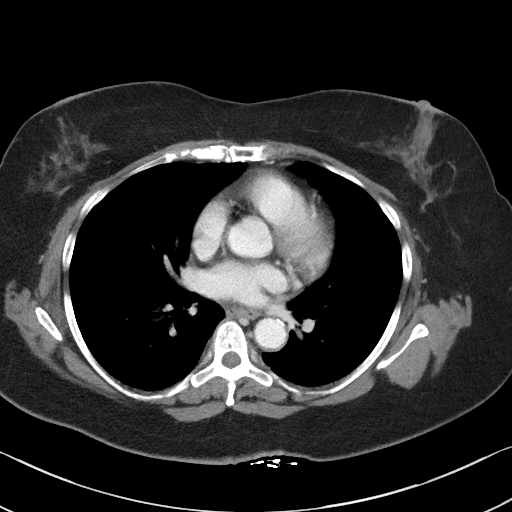

[Series 5: coronal st · coronal · 0.78mm/px · 3 of 82 slices shown]
[im 28/82  soft-tissue]
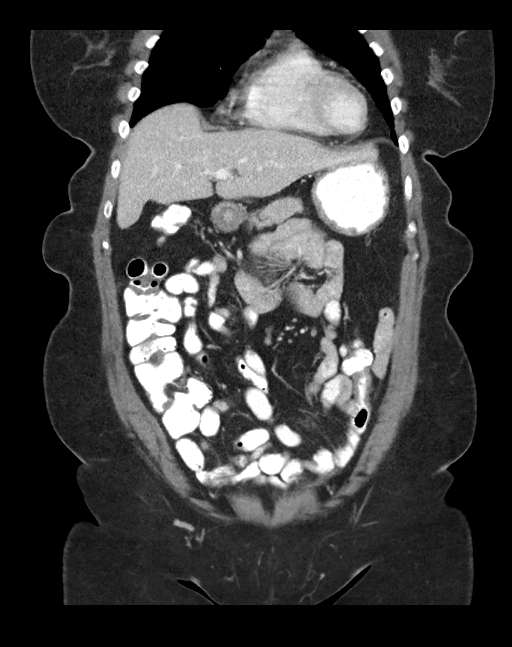
[im 37/82  soft-tissue]
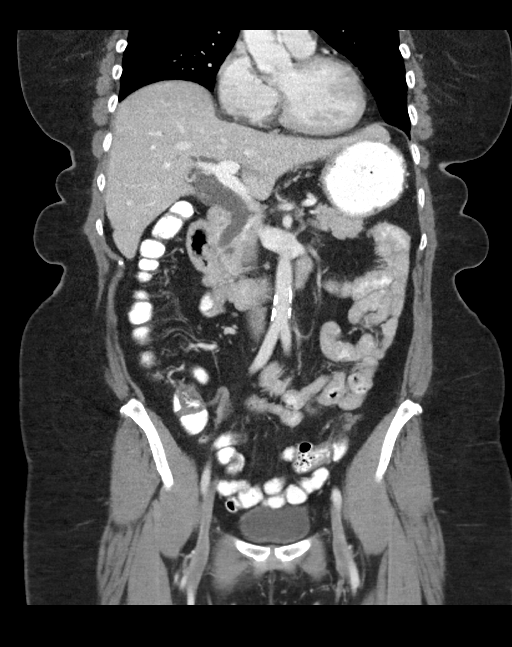
[im 46/82  soft-tissue]
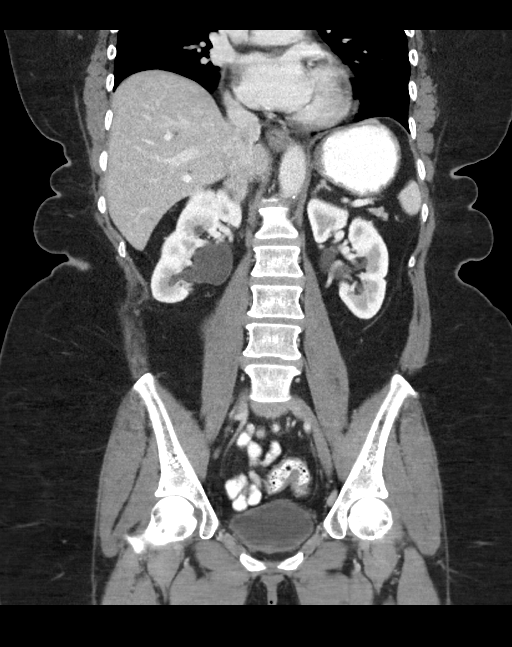

[15 of 46 positions shown; findings below may reference images not displayed]

FINDINGS: Lower chest: The lung bases are clear of acute process. No pleural
effusion or pulmonary lesions. The heart is normal in size. No
pericardial effusion. The distal esophagus and aorta are
unremarkable.

Hepatobiliary: No focal hepatic lesions are identified except for a
small lipoma in the left hepatic lobe. Stable mild intra and
extrahepatic biliary dilatation due to prior cholecystectomy.

Pancreas: No mass, inflammation or ductal dilatation.

Spleen: Normal size.  No focal lesions.

Adrenals/Urinary Tract: The adrenal glands and kidneys are
unremarkable. Bilateral extrarenal pelves are noted but no worrisome
renal lesions or hydronephrosis. No obstructing ureteral calculi or
bladder calculi.

Stomach/Bowel: The stomach, duodenum, small bowel and colon are
unremarkable. No acute inflammatory changes, mass lesions or
obstructive findings. The terminal ileum is normal. The appendix is
normal.

Surgical changes noted involving the sigmoid colon. There appears to
be an end to side anastomosis with a fairly long blind-ending loop
of sigmoid colon in the left pelvis containing diverticuli. I do not
see any evidence of acute inflammation or obstruction.

Vascular/Lymphatic: Stable moderate atherosclerotic calcifications
involving the aorta. No aneurysm or dissection. The branch vessels
are patent. The major venous structures are patent. Small scattered
mesenteric and retroperitoneal lymph nodes but no mass or
adenopathy.

Reproductive: Surgically absent.

Other: Small amount of fluid in the right pelvis. No inguinal mass
or adenopathy. No abdominal wall hernia or subcutaneous lesions.

Musculoskeletal: No significant bony findings.
IMPRESSION: 1. No acute abdominal/pelvic findings, mass lesions or adenopathy.
2. Status post cholecystectomy with stable intra and extrahepatic
biliary dilatation.
3. Surgical changes involving the sigmoid colon as detailed above.

## 2019-08-14 ENCOUNTER — Other Ambulatory Visit: Payer: Self-pay

## 2019-08-14 ENCOUNTER — Ambulatory Visit (INDEPENDENT_AMBULATORY_CARE_PROVIDER_SITE_OTHER): Payer: Medicare Other | Admitting: Gastroenterology

## 2019-08-14 ENCOUNTER — Encounter: Payer: Self-pay | Admitting: Gastroenterology

## 2019-08-14 VITALS — BP 126/88 | HR 77 | Temp 98.2°F | Ht 60.25 in | Wt 158.1 lb

## 2019-08-14 DIAGNOSIS — R197 Diarrhea, unspecified: Secondary | ICD-10-CM

## 2019-08-14 DIAGNOSIS — Z86010 Personal history of colonic polyps: Secondary | ICD-10-CM

## 2019-08-14 DIAGNOSIS — R1030 Lower abdominal pain, unspecified: Secondary | ICD-10-CM

## 2019-08-14 DIAGNOSIS — R112 Nausea with vomiting, unspecified: Secondary | ICD-10-CM

## 2019-08-14 NOTE — Progress Notes (Signed)
Chief Complaint: Abdominal pain  Referring Provider:  Serita Grammes, MD      ASSESSMENT AND PLAN;   #1. Lower abdominal pain (resolved). Neg CT AP Jan 2020, Neg colon 05/2019 except for small tubular adenoma, mild new sigmoid diverticulosis. EGD 05/2019 small transient HH, mild gastritis, neg SB Bx and HP.  #2. Diarrhea.  Likely d/t medicines.  Could have element of postchole diarrhea.  #3. H/O diverticulitis s/p lap sigmoid resection 02/2011  #3.  H/O colonic polyps (TA) 05/2019  Plan: - Bentyl 10mg  po bid to continue. - Stool studies for GI Pathogen (includes C. Diff), WBCs, culture,O&P, fecal elastase, fat, hemoccult - Stop magnesium supplements. - Call me in 2 weeks. - If still problems, trial of cholestyramine 4 g p.o. daily. - FU in 12 weeks.  HPI:    Kelli Wagner is a 73 y.o. female  Referred by Dr. Nicki Reaper FU Husband passed away 05-18-19.  Patient was crying as she misses him a lot.  He was always there for her and would come in for appointments with her.  He had kidney problems and was on hospice before he passed away.     Cacy has been doing well.  She does get upset when she talks about her husband.   The abdominal pain is better  Diarrhea has persisted 2-3 times per day.    She does bring in medication list and has been taking magnesium supplements 400mg /day.  Also has magnesium and calcium supplements.  List has been sent to be scanned in.  No weight loss.  She does sleep well.  No upper GI symptoms.  She finished taking omeprazole.  Past Medical History:  Diagnosis Date  . Allergy   . Anemia   . Anxiety    situational due to death of husband   . Bursitis   . Cataract    forming   . History of diverticulitis    with hemorrhage (recurrent)   . Hx of Clostridium difficile infection   . Hypercholesteremia   . Hypertension    elevated since death of husband 04/27/19    Past Surgical History:  Procedure Laterality Date  . ABDOMINAL  HYSTERECTOMY    . CHOLECYSTECTOMY    . COLONOSCOPY  04/30/2014   Colonic polyps status post polypectomy. Otherwise normal postoperative colonoscopy.   [facility]land Kitchen KIDNEY SURGERY    . Laproscopic sigmoid colon resection with primary anastomosis    . POLYPECTOMY    . TONSILLECTOMY      Family History  Problem Relation Age of Onset  . Colon cancer Neg Hx   . Colon polyps Neg Hx   . Esophageal cancer Neg Hx   . Rectal cancer Neg Hx   . Stomach cancer Neg Hx     Social History   Tobacco Use  . Smoking status: Former Research scientist (life sciences)  . Smokeless tobacco: Never Used  Substance Use Topics  . Alcohol use: Not Currently    Frequency: Never    Comment: quit 1968  . Drug use: Never    No Known Allergies  Review of Systems:  Constitutional: Denies fever, chills, diaphoresis, appetite change and fatigue.  HEENT: Denies photophobia, eye pain, redness, hearing loss, ear pain, congestion, sore throat, rhinorrhea, sneezing, mouth sores, neck pain, neck stiffness and tinnitus.   Respiratory: Denies SOB, DOE, cough, chest tightness,  and wheezing.   Cardiovascular: Denies chest pain, palpitations and leg swelling.  Genitourinary: Denies dysuria, urgency, frequency, hematuria, flank pain and difficulty urinating.  Musculoskeletal: Denies myalgias, back pain, joint swelling, arthralgias and gait problem.  Skin: No rash.  Neurological: Denies dizziness, seizures, syncope, weakness, light-headedness, numbness and headaches.  Hematological: Denies adenopathy. Easy bruising, personal or family bleeding history  Psychiatric/Behavioral: No anxiety or depression     Physical Exam:    BP 126/88   Pulse 77   Temp 98.2 F (36.8 C)   Ht 5' 0.25" (1.53 m)   Wt 158 lb 2 oz (71.7 kg)   BMI 30.63 kg/m  Filed Weights   08/14/19 1325  Weight: 158 lb 2 oz (71.7 kg)   Constitutional:  Well-developed, in no acute distress. Psychiatric: Normal mood and affect. Behavior is normal. HEENT: Pupils normal.  Conjunctivae  are normal. No scleral icterus. Neck supple.  Cardiovascular: Normal rate, regular rhythm. No edema Pulmonary/chest: Effort normal and breath sounds normal. No wheezing, rales or rhonchi. Abdominal: Soft, nondistended.  Mild lower abdominal tenderness, no rebound, bowel sounds active throughout. There are no masses palpable. No hepatomegaly.  Rectal:  defered Neurological: Alert and oriented to person place and time. Skin: Skin is warm and dry. No rashes noted. 25 minutes spent with the patient today. Greater than 50% was spent in counseling and coordination of care with the patient    Carmell Austria, MD 08/14/2019, 1:44 PM  Cc: Serita Grammes, MD

## 2019-08-14 NOTE — Patient Instructions (Signed)
If you are age 72 or older, your body mass index should be between 23-30. Your Body mass index is 30.63 kg/m. If this is out of the aforementioned range listed, please consider follow up with your Primary Care Provider.  If you are age 48 or younger, your body mass index should be between 19-25. Your Body mass index is 30.63 kg/m. If this is out of the aformentioned range listed, please consider follow up with your Primary Care Provider.   To help prevent the possible spread of infection to our patients, communities, and staff; we will be implementing the following measures:  As of now we are not allowing any visitors/family members to accompany you to any upcoming appointments with [facility]. If you have any concerns about this please contact our office to discuss prior to the appointment.   Your provider has requested that you return your stool samples to the basement level for lab work at our [place] location ([address] [place] [address]) . Press "B" on the elevator. The lab is located at the first door on the left as you exit the elevator. You may go at whatever time is convienent for you. The current hours of operations are Monday- Friday 7:30am-4:30pm.  Please call Dr. Leland Her nurse Faythe Casa, RN)  in 2 weeks at [phone]  to let her now how you are doing.   Stop your Magnesium Supplements.  Please call our office at [phone] to set up your 3 month follow up visit.  Thank you,  Dr. Jackquline Denmark

## 2019-08-28 ENCOUNTER — Telehealth: Payer: Self-pay | Admitting: Gastroenterology

## 2019-08-28 NOTE — Telephone Encounter (Signed)
Please see previous message-follow up

## 2019-08-29 NOTE — Telephone Encounter (Signed)
Glad to know Thanks RG

## 2020-01-01 ENCOUNTER — Telehealth: Payer: Self-pay

## 2020-01-01 NOTE — Telephone Encounter (Signed)
Entry error

## 2020-06-30 ENCOUNTER — Other Ambulatory Visit: Payer: Self-pay | Admitting: Gastroenterology

## 2020-06-30 DIAGNOSIS — K594 Anal spasm: Secondary | ICD-10-CM

## 2020-11-12 DIAGNOSIS — Z1231 Encounter for screening mammogram for malignant neoplasm of breast: Secondary | ICD-10-CM | POA: Diagnosis not present

## 2020-11-15 DIAGNOSIS — L819 Disorder of pigmentation, unspecified: Secondary | ICD-10-CM | POA: Diagnosis not present

## 2020-11-15 DIAGNOSIS — G8929 Other chronic pain: Secondary | ICD-10-CM | POA: Diagnosis not present

## 2020-11-15 DIAGNOSIS — Z6832 Body mass index (BMI) 32.0-32.9, adult: Secondary | ICD-10-CM | POA: Diagnosis not present

## 2020-11-15 DIAGNOSIS — M549 Dorsalgia, unspecified: Secondary | ICD-10-CM | POA: Diagnosis not present

## 2020-11-15 DIAGNOSIS — R0789 Other chest pain: Secondary | ICD-10-CM | POA: Diagnosis not present

## 2020-11-24 DIAGNOSIS — I10 Essential (primary) hypertension: Secondary | ICD-10-CM | POA: Insufficient documentation

## 2020-11-24 DIAGNOSIS — Z8619 Personal history of other infectious and parasitic diseases: Secondary | ICD-10-CM

## 2020-11-24 DIAGNOSIS — F419 Anxiety disorder, unspecified: Secondary | ICD-10-CM

## 2020-11-24 DIAGNOSIS — E7800 Pure hypercholesterolemia, unspecified: Secondary | ICD-10-CM | POA: Insufficient documentation

## 2020-11-24 DIAGNOSIS — H269 Unspecified cataract: Secondary | ICD-10-CM | POA: Insufficient documentation

## 2020-11-24 DIAGNOSIS — M719 Bursopathy, unspecified: Secondary | ICD-10-CM | POA: Insufficient documentation

## 2020-11-24 DIAGNOSIS — D649 Anemia, unspecified: Secondary | ICD-10-CM | POA: Insufficient documentation

## 2020-11-24 DIAGNOSIS — Z8719 Personal history of other diseases of the digestive system: Secondary | ICD-10-CM | POA: Insufficient documentation

## 2020-11-24 DIAGNOSIS — T7840XA Allergy, unspecified, initial encounter: Secondary | ICD-10-CM | POA: Insufficient documentation

## 2020-11-26 ENCOUNTER — Encounter: Payer: Self-pay | Admitting: Cardiology

## 2020-11-26 ENCOUNTER — Ambulatory Visit (INDEPENDENT_AMBULATORY_CARE_PROVIDER_SITE_OTHER): Payer: Medicare PPO | Admitting: Cardiology

## 2020-11-26 ENCOUNTER — Other Ambulatory Visit: Payer: Self-pay

## 2020-11-26 VITALS — BP 132/82 | HR 75 | Ht 60.0 in | Wt 172.4 lb

## 2020-11-26 DIAGNOSIS — E669 Obesity, unspecified: Secondary | ICD-10-CM | POA: Diagnosis not present

## 2020-11-26 DIAGNOSIS — I10 Essential (primary) hypertension: Secondary | ICD-10-CM | POA: Diagnosis not present

## 2020-11-26 DIAGNOSIS — R7303 Prediabetes: Secondary | ICD-10-CM

## 2020-11-26 DIAGNOSIS — R0789 Other chest pain: Secondary | ICD-10-CM | POA: Diagnosis not present

## 2020-11-26 DIAGNOSIS — E782 Mixed hyperlipidemia: Secondary | ICD-10-CM | POA: Diagnosis not present

## 2020-11-26 MED ORDER — METOPROLOL TARTRATE 100 MG PO TABS: 100.0000 mg | ORAL_TABLET | ORAL | 3 refills | Status: DC

## 2020-11-26 NOTE — Progress Notes (Signed)
Cardiology Office Note:    Date:  11/26/2020   ID:  Kelli Wagner, DOB [birth year], MRN [id number]  PCP:  Serita Grammes, MD  Cardiologist:  Berniece Salines, DO  Electrophysiologist:  None   Referring MD: Serita Grammes, MD   I have been having some chest pain  History of Present Illness:    Kelli Wagner is a 74 y.o. female with a hx of obesity, hypertension, hyperlipidemia, family history of MI in her mother unclear at what age, presents today to be evaluated for intermittent chest pain. Patient described this as a pressure-like sensation. She said it comes and goes but is not able to tell me how long it lasts. She said nothing makes it better or worse. It resolves on its own. She notes that it started sometime later in August and it has been going on since that time. She described this as a pressure-like sensation and sometimes an aching sensation She does not have any shortness of breath with this. But she does have some lightheadedness.  No other complaint at this time.  I reviewed the lab work which was done with the patient PCPs office resolved hemoglobin A1c 6.2% I did educate the patient that this is prediabetes but she was not aware of this finding and also had questions about this diagnosis which I was able to answer. On August 27, 2020 she had lipid profile showed HDL 58, LDL 128, total cholesterol 201, triglyceride 102.    Past Medical History:  Diagnosis Date   Allergy    Anemia    Anxiety    situational due to death of husband    Bursitis    Cataract    forming    History of diverticulitis    with hemorrhage (recurrent)    Hx of Clostridium difficile infection    Hypercholesteremia    Hypertension    elevated since death of husband 2019-04-20    Past Surgical History:  Procedure Laterality Date   ABDOMINAL HYSTERECTOMY     CHOLECYSTECTOMY     COLONOSCOPY  04/30/2014   Colonic polyps status post polypectomy. Otherwise normal postoperative  colonoscopy.    KIDNEY SURGERY     Laproscopic sigmoid colon resection with primary anastomosis     POLYPECTOMY     TONSILLECTOMY      Current Medications: Current Meds  Medication Sig   aspirin 81 MG chewable tablet Chew by mouth daily. Three times a week   azelastine (ASTELIN) 0.1 % nasal spray Place into both nostrils 2 (two) times daily. Use in each nostril as directed   calcium-vitamin D (OSCAL WITH D) 500-200 MG-UNIT tablet Take 1 tablet by mouth.   cholecalciferol (VITAMIN D3) 25 MCG (1000 UT) tablet Take 2,000 Units by mouth daily.   dicyclomine (BENTYL) 10 MG capsule TAKE ONE CAPSULE BY MOUTH TWICE A DAY   ferrous sulfate 325 (65 FE) MG tablet Take 325 mg by mouth daily with breakfast.   fexofenadine (ALLEGRA) 180 MG tablet Take 180 mg by mouth daily.   meloxicam (MOBIC) 15 MG tablet Take 15 mg by mouth daily as needed for pain.   montelukast (SINGULAIR) 10 MG tablet Take 10 mg by mouth at bedtime.   Multiple Vitamins-Minerals (WOMENS 50+ MULTI VITAMIN/MIN PO) Take by mouth.   OVER THE COUNTER MEDICATION 2 Tumeric with pepper daily at night, 1 in the morning   Rosuvastatin Calcium 20 MG CPSP Take 10 mg by mouth daily at 6 PM.    vitamin C (ASCORBIC  ACID) 500 MG tablet Take 500 mg by mouth daily.     Allergies:   Patient has no known allergies.   Social History   Socioeconomic History   Marital status: Married    Spouse name: Not on file   Number of children: Not on file   Years of education: Not on file   Highest education level: Not on file  Occupational History   Not on file  Tobacco Use   Smoking status: Former Smoker   Smokeless tobacco: Never Used  Scientific laboratory technician Use: Never used  Substance and Sexual Activity   Alcohol use: Not Currently    Comment: quit 1968   Drug use: Never   Sexual activity: Not on file  Other Topics Concern   Not on file  Social History Narrative   Not on file   Social Determinants of Health    Financial Resource Strain: Not on file  Food Insecurity: Not on file  Transportation Needs: Not on file  Physical Activity: Not on file  Stress: Not on file  Social Connections: Not on file     Family History: The patient's family history includes CAD in her mother; Heart failure in her father. There is no history of Colon cancer, Colon polyps, Esophageal cancer, Rectal cancer, or Stomach cancer.  ROS:   Review of Systems  Constitution: Negative for decreased appetite, fever and weight gain.  HENT: Negative for congestion, ear discharge, hoarse voice and sore throat.   Eyes: Negative for discharge, redness, vision loss in right eye and visual halos.  Cardiovascular: Reports chest pain. Negative for dyspnea on exertion, leg swelling, orthopnea and palpitations.  Respiratory: Negative for cough, hemoptysis, shortness of breath and snoring.   Endocrine: Negative for heat intolerance and polyphagia.  Hematologic/Lymphatic: Negative for bleeding problem. Does not bruise/bleed easily.  Skin: Negative for flushing, nail changes, rash and suspicious lesions.  Musculoskeletal: Negative for arthritis, joint pain, muscle cramps, myalgias, neck pain and stiffness.  Gastrointestinal: Negative for abdominal pain, bowel incontinence, diarrhea and excessive appetite.  Genitourinary: Negative for decreased libido, genital sores and incomplete emptying.  Neurological: Negative for brief paralysis, focal weakness, headaches and loss of balance.  Psychiatric/Behavioral: Negative for altered mental status, depression and suicidal ideas.  Allergic/Immunologic: Negative for HIV exposure and persistent infections.    EKGs/Labs/Other Studies Reviewed:    The following studies were reviewed today:   EKG:  The ekg ordered today demonstrates sinus rhythm, heart rate 75 bpm  Recent Labs: No results found for requested labs within last 8760 hours.  Recent Lipid Panel No results found for: CHOL, TRIG,  HDL, CHOLHDL, VLDL, LDLCALC, LDLDIRECT  Physical Exam:    VS:  BP 132/82 (BP Location: Right Arm)    Pulse 75    Ht 5' (1.524 m)    Wt 172 lb 6.4 oz (78.2 kg)    SpO2 98%    BMI 33.67 kg/m     Wt Readings from Last 3 Encounters:  11/26/20 172 lb 6.4 oz (78.2 kg)  08/14/19 158 lb 2 oz (71.7 kg)  05/22/19 174 lb (78.9 kg)     GEN: Well nourished, well developed in no acute distress HEENT: Normal NECK: No JVD; No carotid bruits LYMPHATICS: No lymphadenopathy CARDIAC: S1S2 noted,RRR, 2 out of 6 midsystolic ejection murmurs, rubs, gallops RESPIRATORY:  Clear to auscultation without rales, wheezing or rhonchi  ABDOMEN: Soft, non-tender, non-distended, +bowel sounds, no guarding. EXTREMITIES: No edema, No cyanosis, no clubbing MUSCULOSKELETAL:  No deformity  SKIN: Warm and dry NEUROLOGIC:  Alert and oriented x 3, non-focal PSYCHIATRIC:  Normal affect, good insight  ASSESSMENT:    1. Other chest pain   2. Primary hypertension   3. Prediabetes   4. Mixed hyperlipidemia   5. Obesity (BMI 30-39.9)    PLAN:     The characteristics of her pain is concerning and coupled with her risk factors (age, prediabetes, hypertension, hyperlipidemia) and her 10-year ASCVD risk calculated score is 32.9%. Like to proceed with an ischemic evaluation in this patient. I discussed with the patient multiple diagnostic testing modality. We agreed on the coronary CTA. She has no IV contrast allergy.  Blood pressures acceptable no changes will be made to her medication regimen.  Hyperlipidemia continue lipid-lowering agent.  Obesity-the patient understands the need to lose weight with diet and exercise. We have discussed specific strategies for this.  Prediabetes-lifestyle modification.  The patient is in agreement with the above plan. The patient left the office in stable condition.  The patient will follow up in 3 months.   Medication Adjustments/Labs and Tests Ordered: Current medicines are  reviewed at length with the patient today.  Concerns regarding medicines are outlined above.  Orders Placed This Encounter  Procedures   CT CORONARY MORPH W/CTA COR W/SCORE W/CA W/CM &/OR WO/CM   CT CORONARY FRACTIONAL FLOW RESERVE DATA PREP   CT CORONARY FRACTIONAL FLOW RESERVE FLUID ANALYSIS   Basic metabolic panel   EKG 09-NATF   ECHOCARDIOGRAM COMPLETE   Meds ordered this encounter  Medications   metoprolol tartrate (LOPRESSOR) 100 MG tablet    Sig: Take 1 tablet (100 mg total) by mouth as directed. Take 1 tablet by mouth 2 hours before CT    Dispense:  1 tablet    Refill:  3    Patient Instructions  Medication Instructions:  Your physician recommends that you continue on your current medications as directed. Please refer to the Current Medication list given to you today.  *If you need a refill on your cardiac medications before your next appointment, please call your pharmacy*   Lab Work: Your physician recommends that you return for lab work 1 week before you Ct, No appointment needed   If you have labs (blood work) drawn today and your tests are completely normal, you will receive your results only by:  [facility] (if you have MyChart) OR  A paper copy in the mail If you have any lab test that is abnormal or we need to change your treatment, we will call you to review the results.   Testing/Procedures: Your physician has requested that you have an echocardiogram. Echocardiography is a painless test that uses sound waves to create images of your heart. It provides your doctor with information about the size and shape of your heart and how well your hearts chambers and valves are working. This procedure takes approximately one hour. There are no restrictions for this procedure.  Your physician has ordered for you to have a cardiac CT   Follow-Up: At [facility], you and your health needs are our priority.  As part of our continuing mission to provide you  with exceptional heart care, we have created designated Provider Care Teams.  These Care Teams include your primary Cardiologist (physician) and Advanced Practice Providers (APPs -  Physician Assistants and Nurse Practitioners) who all work together to provide you with the care you need, when you need it.  We recommend signing up for the patient portal called "MyChart".  Sign up information is provided on this After Visit Summary.  MyChart is used to connect with patients for Virtual Visits (Telemedicine).  Patients are able to view lab/test results, encounter notes, upcoming appointments, etc.  Non-urgent messages can be sent to your provider as well.   To learn more about what you can do with MyChart, go to [email or web address].    Your next appointment:   3 month(s)  The format for your next appointment:   In Person  Provider:   Berniece Salines, DO   Other Instructions Your cardiac CT will be scheduled at one of the below locations:   [facility] [address] [place], [place] [address] [phone]  If scheduled at [facility], please arrive at the [facility] main entrance of [facility] 30 minutes prior to test start time. Proceed to the [facility] Radiology Department (first floor) to check-in and test prep.  Please follow these instructions carefully (unless otherwise directed):  On the Night Before the Test:  Be sure to Drink plenty of water.  Do not consume any caffeinated/decaffeinated beverages or chocolate 12 hours prior to your test.  Do not take any antihistamines 12 hours prior to your test.  On the Day of the Test:  Drink plenty of water. Do not drink any water within one hour of the test.  Do not eat any food 4 hours prior to the test.  You may take your regular medications prior to the test.   Take metoprolol (Lopressor) two hours prior to test.  HOLD Furosemide/Hydrochlorothiazide morning of the test.  FEMALES- please  wear underwire-free bra if available      After the Test:  Drink plenty of water.  After receiving IV contrast, you may experience a mild flushed feeling. This is normal.  On occasion, you may experience a mild rash up to 24 hours after the test. This is not dangerous. If this occurs, you can take Benadryl 25 mg and increase your fluid intake.  If you experience trouble breathing, this can be serious. If it is severe call 911 IMMEDIATELY. If it is mild, please call our office.   Once we have confirmed authorization from your insurance company, we will call you to set up a date and time for your test. Based on how quickly your insurance processes prior authorizations requests, please allow up to 4 weeks to be contacted for scheduling your Cardiac CT appointment. Be advised that routine Cardiac CT appointments could be scheduled as many as 8 weeks after your provider has ordered it.  For non-scheduling related questions, please contact the cardiac imaging nurse navigator should you have any questions/concerns: Marchia Bond, Cardiac Imaging Nurse Navigator Burley Saver, Interim Cardiac Imaging Nurse [place] and V[facility] Services Direct Office Dial: [phone]   For scheduling needs, including cancellations and rescheduling, please call [place], [phone].        Adopting a Healthy Lifestyle.  Know what a healthy weight is for you (roughly BMI <25) and aim to maintain this   Aim for 7+ servings of fruits and vegetables daily   65-80+ fluid ounces of water or unsweet tea for healthy kidneys   Limit to max 1 drink of alcohol per day; avoid smoking/tobacco   Limit animal fats in diet for cholesterol and heart health - choose grass fed whenever available   Avoid highly processed foods, and foods high in saturated/trans fats   Aim for low stress - take time to unwind and  care for your mental health   Aim for 150 min of moderate intensity exercise weekly for  heart health, and weights twice weekly for bone health   Aim for 7-9 hours of sleep daily   When it comes to diets, agreement about the perfect plan isnt easy to find, even among the experts. Experts at the [employer] developed an idea known as the Healthy Eating Plate. Just imagine a plate divided into logical, healthy portions.   The emphasis is on diet quality:   Load up on vegetables and fruits - one-half of your plate: Aim for color and variety, and remember that potatoes dont count.   Go for whole grains - one-quarter of your plate: Whole wheat, barley, wheat berries, quinoa, oats, brown rice, and foods made with them. If you want pasta, go with whole wheat pasta.   Protein power - one-quarter of your plate: Fish, chicken, beans, and nuts are all healthy, versatile protein sources. Limit red meat.   The diet, however, does go beyond the plate, offering a few other suggestions.   Use healthy plant oils, such as olive, canola, soy, corn, sunflower and peanut. Check the labels, and avoid partially hydrogenated oil, which have unhealthy trans fats.   If youre thirsty, drink water. Coffee and tea are good in moderation, but skip sugary drinks and limit milk and dairy products to one or two daily servings.   The type of carbohydrate in the diet is more important than the amount. Some sources of carbohydrates, such as vegetables, fruits, whole grains, and beans-are healthier than others.   Finally, stay active  Signed, Berniece Salines, DO  11/26/2020 10:30 AM    [place]

## 2020-11-26 NOTE — Patient Instructions (Addendum)
Medication Instructions:  Your physician recommends that you continue on your current medications as directed. Please refer to the Current Medication list given to you today.  *If you need a refill on your cardiac medications before your next appointment, please call your pharmacy*   Lab Work: Your physician recommends that you return for lab work 1 week before you Ct, No appointment needed   If you have labs (blood work) drawn today and your tests are completely normal, you will receive your results only by: Marland Kitchen MyChart Message (if you have MyChart) OR . A paper copy in the mail If you have any lab test that is abnormal or we need to change your treatment, we will call you to review the results.   Testing/Procedures: Your physician has requested that you have an echocardiogram. Echocardiography is a painless test that uses sound waves to create images of your heart. It provides your doctor with information about the size and shape of your heart and how well your heart's chambers and valves are working. This procedure takes approximately one hour. There are no restrictions for this procedure.  Your physician has ordered for you to have a cardiac CT   Follow-Up: At [facility], you and your health needs are our priority.  As part of our continuing mission to provide you with exceptional heart care, we have created designated Provider Care Teams.  These Care Teams include your primary Cardiologist (physician) and Advanced Practice Providers (APPs -  Physician Assistants and Nurse Practitioners) who all work together to provide you with the care you need, when you need it.  We recommend signing up for the patient portal called "MyChart".  Sign up information is provided on this After Visit Summary.  MyChart is used to connect with patients for Virtual Visits (Telemedicine).  Patients are able to view lab/test results, encounter notes, upcoming appointments, etc.  Non-urgent messages can be sent  to your provider as well.   To learn more about what you can do with MyChart, go to [email or web address].    Your next appointment:   3 month(s)  The format for your next appointment:   In Person  Provider:   Berniece Salines, DO   Other Instructions Your cardiac CT will be scheduled at one of the below locations:   [facility] [address] [place], [place] [address] [phone]  If scheduled at [facility], please arrive at the [facility] main entrance of [facility] 30 minutes prior to test start time. Proceed to the [facility] Radiology Department (first floor) to check-in and test prep.  Please follow these instructions carefully (unless otherwise directed):  On the Night Before the Test: . Be sure to Drink plenty of water. . Do not consume any caffeinated/decaffeinated beverages or chocolate 12 hours prior to your test. . Do not take any antihistamines 12 hours prior to your test.  On the Day of the Test: . Drink plenty of water. Do not drink any water within one hour of the test. . Do not eat any food 4 hours prior to the test. . You may take your regular medications prior to the test.  . Take metoprolol (Lopressor) two hours prior to test. . HOLD Furosemide/Hydrochlorothiazide morning of the test. . FEMALES- please wear underwire-free bra if available      After the Test: . Drink plenty of water. . After receiving IV contrast, you may experience a mild flushed feeling. This is normal. . On occasion,  you may experience a mild rash up to 24 hours after the test. This is not dangerous. If this occurs, you can take Benadryl 25 mg and increase your fluid intake. . If you experience trouble breathing, this can be serious. If it is severe call 911 IMMEDIATELY. If it is mild, please call our office.   Once we have confirmed authorization from your insurance company, we will call you to set up a date and time for your test. Based on how  quickly your insurance processes prior authorizations requests, please allow up to 4 weeks to be contacted for scheduling your Cardiac CT appointment. Be advised that routine Cardiac CT appointments could be scheduled as many as 8 weeks after your provider has ordered it.  For non-scheduling related questions, please contact the cardiac imaging nurse navigator should you have any questions/concerns: Marchia Bond, Cardiac Imaging Nurse Navigator Burley Saver, Interim Cardiac Imaging Nurse [place] and V[facility] Services Direct Office Dial: [phone]   For scheduling needs, including cancellations and rescheduling, please call [place], [phone].

## 2020-11-29 ENCOUNTER — Other Ambulatory Visit: Payer: Medicare PPO

## 2020-11-29 DIAGNOSIS — Z6831 Body mass index (BMI) 31.0-31.9, adult: Secondary | ICD-10-CM | POA: Diagnosis not present

## 2020-11-29 DIAGNOSIS — R519 Headache, unspecified: Secondary | ICD-10-CM | POA: Diagnosis not present

## 2020-11-29 DIAGNOSIS — R42 Dizziness and giddiness: Secondary | ICD-10-CM | POA: Diagnosis not present

## 2020-11-29 DIAGNOSIS — R197 Diarrhea, unspecified: Secondary | ICD-10-CM | POA: Diagnosis not present

## 2020-11-29 DIAGNOSIS — H6983 Other specified disorders of Eustachian tube, bilateral: Secondary | ICD-10-CM | POA: Diagnosis not present

## 2020-11-30 ENCOUNTER — Other Ambulatory Visit: Payer: Self-pay

## 2020-11-30 ENCOUNTER — Ambulatory Visit (INDEPENDENT_AMBULATORY_CARE_PROVIDER_SITE_OTHER): Payer: Medicare PPO

## 2020-11-30 DIAGNOSIS — R0789 Other chest pain: Secondary | ICD-10-CM

## 2020-11-30 LAB — ECHOCARDIOGRAM COMPLETE
Area-P 1/2: 3.6 cm2
S' Lateral: 2.5 cm

## 2020-12-06 ENCOUNTER — Telehealth: Payer: Self-pay

## 2020-12-06 NOTE — Telephone Encounter (Signed)
-----   Message from Thomasene Ripple, DO sent at 12/01/2020 12:03 PM EST ----- Your ejection fraction is normal, but your left ventricular wall is severely thickened which is usually secondary to hypertension.  There is also impaired relaxation due to this.  I can explain it to you at your next visit.

## 2020-12-06 NOTE — Telephone Encounter (Signed)
Spoke with patient regarding results and recommendation.  Patient verbalizes understanding and is agreeable to plan of care. Advised patient to call back with any issues or concerns.  

## 2020-12-09 ENCOUNTER — Telehealth: Payer: Self-pay

## 2020-12-09 DIAGNOSIS — R0789 Other chest pain: Secondary | ICD-10-CM | POA: Diagnosis not present

## 2020-12-09 LAB — BASIC METABOLIC PANEL
BUN/Creatinine Ratio: 15 (ref 12–28)
BUN: 13 mg/dL (ref 8–27)
CO2: 28 mmol/L (ref 20–29)
Calcium: 10 mg/dL (ref 8.7–10.3)
Chloride: 103 mmol/L (ref 96–106)
Creatinine, Ser: 0.88 mg/dL (ref 0.57–1.00)
GFR calc Af Amer: 75 mL/min/{1.73_m2} (ref >59)
GFR calc non Af Amer: 65 mL/min/{1.73_m2} (ref >59)
Glucose: 105 mg/dL — ABNORMAL HIGH (ref 65–99)
Potassium: 4.2 mmol/L (ref 3.5–5.2)
Sodium: 144 mmol/L (ref 134–144)

## 2020-12-09 NOTE — Telephone Encounter (Signed)
-----   Message from Thomasene Ripple, DO sent at 12/09/2020  4:53 PM EST ----- Blood glucose slightly elevated but otherwise normal lab

## 2020-12-09 NOTE — Telephone Encounter (Signed)
Spoke with patient regarding results and recommendation.  Patient verbalizes understanding and is agreeable to plan of care. Advised patient to call back with any issues or concerns.  

## 2020-12-14 ENCOUNTER — Telehealth (HOSPITAL_COMMUNITY): Payer: Self-pay | Admitting: *Deleted

## 2020-12-14 NOTE — Telephone Encounter (Signed)
Reaching out to patient to offer assistance regarding upcoming cardiac imaging study; pt verbalizes understanding of appt date/time but would like to talk to Dr. Servando Salina about the need for the test. Pt will back about proceeding.  Larey Brick RN Navigator Cardiac Imaging [facility] Heart and Vascular [phone] office [phone] cell

## 2020-12-15 ENCOUNTER — Telehealth (HOSPITAL_COMMUNITY): Payer: Self-pay | Admitting: *Deleted

## 2020-12-15 ENCOUNTER — Other Ambulatory Visit (HOSPITAL_COMMUNITY): Payer: Self-pay | Admitting: *Deleted

## 2020-12-15 DIAGNOSIS — R0789 Other chest pain: Secondary | ICD-10-CM

## 2020-12-15 NOTE — Telephone Encounter (Signed)
Pt calling to reschedule Cardiac CT since she is uncomfortable with the positivity rate of COVID and possible change of precipitation on Dec 16, 2020.  Per pt's request, pt re-scheduled for January 18, 2021 at 11:30am.  Pt made aware that she would need to get new labs for the test. Pt expressed understanding.  Orders for a BMET placed.  Larey Brick, Cardiac Imaging Nurse Navigator. Office; [phone]

## 2020-12-16 ENCOUNTER — Ambulatory Visit (HOSPITAL_COMMUNITY): Admission: RE | Admit: 2020-12-16 | Payer: Medicare PPO | Source: Ambulatory Visit

## 2021-01-12 ENCOUNTER — Other Ambulatory Visit: Payer: Self-pay

## 2021-01-12 DIAGNOSIS — R0789 Other chest pain: Secondary | ICD-10-CM

## 2021-01-12 DIAGNOSIS — I10 Essential (primary) hypertension: Secondary | ICD-10-CM | POA: Diagnosis not present

## 2021-01-12 DIAGNOSIS — R7303 Prediabetes: Secondary | ICD-10-CM | POA: Diagnosis not present

## 2021-01-12 DIAGNOSIS — E669 Obesity, unspecified: Secondary | ICD-10-CM | POA: Diagnosis not present

## 2021-01-12 DIAGNOSIS — E782 Mixed hyperlipidemia: Secondary | ICD-10-CM | POA: Diagnosis not present

## 2021-01-12 LAB — BASIC METABOLIC PANEL
BUN/Creatinine Ratio: 20 (ref 12–28)
BUN: 15 mg/dL (ref 8–27)
CO2: 26 mmol/L (ref 20–29)
Calcium: 9.7 mg/dL (ref 8.7–10.3)
Chloride: 103 mmol/L (ref 96–106)
Creatinine, Ser: 0.76 mg/dL (ref 0.57–1.00)
GFR calc Af Amer: 90 mL/min/{1.73_m2} (ref >59)
GFR calc non Af Amer: 78 mL/min/{1.73_m2} (ref >59)
Glucose: 99 mg/dL (ref 65–99)
Potassium: 4.3 mmol/L (ref 3.5–5.2)
Sodium: 142 mmol/L (ref 134–144)

## 2021-01-12 NOTE — Addendum Note (Signed)
Addended by: Jerl Santos R on: 01/12/2021 10:29 AM   Modules accepted: Orders

## 2021-01-12 NOTE — Addendum Note (Signed)
Addended by: Jerl Santos R on: 01/12/2021 10:34 AM   Modules accepted: Orders

## 2021-01-17 ENCOUNTER — Telehealth (HOSPITAL_COMMUNITY): Payer: Self-pay | Admitting: Emergency Medicine

## 2021-01-17 NOTE — Telephone Encounter (Signed)
Reaching out to patient to offer assistance regarding upcoming cardiac imaging study; pt verbalizes understanding of appt date/time, parking situation and where to check in, pre-test NPO status and medications ordered, and verified current allergies; name and call back number provided for further questions should they arise Kelli Bond RN Navigator Cardiac Imaging [facility] [phone] office [phone] cell  Pt reminded to take metop tart 2 h prior to scan  Kelli Wagner

## 2021-01-18 ENCOUNTER — Other Ambulatory Visit: Payer: Self-pay

## 2021-01-18 ENCOUNTER — Encounter (HOSPITAL_COMMUNITY): Payer: Self-pay

## 2021-01-18 ENCOUNTER — Encounter: Payer: Medicare PPO | Admitting: *Deleted

## 2021-01-18 ENCOUNTER — Ambulatory Visit (HOSPITAL_COMMUNITY)
Admission: RE | Admit: 2021-01-18 | Discharge: 2021-01-18 | Disposition: A | Discharge status: Home / Self Care | Payer: Medicare PPO | Source: Ambulatory Visit | Attending: Cardiology | Admitting: Cardiology

## 2021-01-18 DIAGNOSIS — R931 Abnormal findings on diagnostic imaging of heart and coronary circulation: Secondary | ICD-10-CM | POA: Insufficient documentation

## 2021-01-18 DIAGNOSIS — R0789 Other chest pain: Secondary | ICD-10-CM | POA: Insufficient documentation

## 2021-01-18 DIAGNOSIS — Z006 Encounter for examination for normal comparison and control in clinical research program: Secondary | ICD-10-CM

## 2021-01-18 DIAGNOSIS — I2510 Atherosclerotic heart disease of native coronary artery without angina pectoris: Secondary | ICD-10-CM | POA: Insufficient documentation

## 2021-01-18 MED ORDER — NITROGLYCERIN 0.4 MG SL SUBL
SUBLINGUAL_TABLET | SUBLINGUAL | Status: AC
  Administered 2021-01-18: 0.8 mg via SUBLINGUAL
  Filled 2021-01-18: qty 2

## 2021-01-18 MED ORDER — IOHEXOL 350 MG/ML SOLN
80.0000 mL | Freq: Once | INTRAVENOUS | Status: AC | PRN
  Administered 2021-01-18: 80 mL via INTRAVENOUS

## 2021-01-18 MED ORDER — NITROGLYCERIN 0.4 MG SL SUBL: 0.8000 mg | SUBLINGUAL_TABLET | Freq: Once | SUBLINGUAL | Status: AC

## 2021-01-18 NOTE — Research (Signed)
Subject Name: Kelli Wagner  Subject met inclusion and exclusion criteria.  The informed consent form, study requirements and expectations were reviewed with the subject and questions and concerns were addressed prior to the signing of the consent form.  The subject verbalized understanding of the trial requirements.  The subject agreed to participate in the Identify trial and signed the informed consent at 0952 on 01/18/21  The informed consent was obtained prior to performance of any protocol-specific procedures for the subject.  A copy of the signed informed consent was given to the subject and a copy was placed in the subject's medical record.   Star Age [place]

## 2021-01-19 DIAGNOSIS — I2510 Atherosclerotic heart disease of native coronary artery without angina pectoris: Secondary | ICD-10-CM | POA: Diagnosis not present

## 2021-01-19 DIAGNOSIS — R931 Abnormal findings on diagnostic imaging of heart and coronary circulation: Secondary | ICD-10-CM | POA: Diagnosis not present

## 2021-01-20 ENCOUNTER — Other Ambulatory Visit: Payer: Self-pay

## 2021-01-20 MED ORDER — ASPIRIN EC 81 MG PO TBEC: 81.0000 mg | DELAYED_RELEASE_TABLET | Freq: Every day | ORAL | 3 refills | Status: DC

## 2021-01-20 NOTE — Progress Notes (Signed)
Spoke with patient about her CT results and some daily medications. Patient verbalizes understanding. No questions or concerns at this time.

## 2021-01-28 DIAGNOSIS — H25813 Combined forms of age-related cataract, bilateral: Secondary | ICD-10-CM | POA: Diagnosis not present

## 2021-02-01 DIAGNOSIS — R7303 Prediabetes: Secondary | ICD-10-CM | POA: Diagnosis not present

## 2021-02-01 DIAGNOSIS — I10 Essential (primary) hypertension: Secondary | ICD-10-CM | POA: Diagnosis not present

## 2021-02-01 DIAGNOSIS — Z6830 Body mass index (BMI) 30.0-30.9, adult: Secondary | ICD-10-CM | POA: Diagnosis not present

## 2021-02-02 ENCOUNTER — Other Ambulatory Visit: Payer: Self-pay

## 2021-02-02 MED ORDER — ASPIRIN EC 81 MG PO TBEC: 81.0000 mg | DELAYED_RELEASE_TABLET | Freq: Every day | ORAL | 3 refills | Status: DC

## 2021-02-02 MED ORDER — ASPIRIN EC 81 MG PO TBEC: 81.0000 mg | DELAYED_RELEASE_TABLET | Freq: Every day | ORAL | 3 refills | Status: AC

## 2021-02-02 NOTE — Progress Notes (Signed)
Prescription resent for patient.

## 2021-02-21 ENCOUNTER — Telehealth: Payer: Self-pay | Admitting: Gastroenterology

## 2021-02-21 NOTE — Telephone Encounter (Signed)
Pt states she had another bout of "food poisoning" on Friday night. She states she ate almonds, pecans, and grapefruit. She reports that night it felt like a tennis ball was rolling around in her stomach. Finally Saturday morning she vomited a large amount and felt better.  Pt wants to know if Dr. Lyndel Safe can send in something to induce vomiting when she has another "attack" like this. States she tried sticking her finger down her throat and a toothbrush and that did not help. Pt aware Dr. Lyndel Safe is out of the country and is fine waiting until he returns. Please advise.

## 2021-02-21 NOTE — Telephone Encounter (Signed)
Pt is requesting a call back from a nurse to discuss some "food poisoning" she is experiencing, pt did not disclose further info,

## 2021-02-25 ENCOUNTER — Ambulatory Visit: Payer: Medicare PPO | Admitting: Cardiology

## 2021-03-02 NOTE — Telephone Encounter (Signed)
Pt notified via mychart

## 2021-03-02 NOTE — Telephone Encounter (Signed)
Glad to know that she is better. We do not recommend any medications to induce vomiting. She needs to let us know if she has another episode. Then would like to investigate further.  RG

## 2021-03-16 ENCOUNTER — Other Ambulatory Visit: Payer: Self-pay

## 2021-03-16 ENCOUNTER — Encounter: Payer: Self-pay | Admitting: Cardiology

## 2021-03-16 ENCOUNTER — Ambulatory Visit (INDEPENDENT_AMBULATORY_CARE_PROVIDER_SITE_OTHER): Payer: Medicare PPO | Admitting: Cardiology

## 2021-03-16 VITALS — BP 164/92 | HR 70 | Ht 60.0 in | Wt 162.6 lb

## 2021-03-16 DIAGNOSIS — R7303 Prediabetes: Secondary | ICD-10-CM

## 2021-03-16 DIAGNOSIS — I2510 Atherosclerotic heart disease of native coronary artery without angina pectoris: Secondary | ICD-10-CM

## 2021-03-16 DIAGNOSIS — E785 Hyperlipidemia, unspecified: Secondary | ICD-10-CM

## 2021-03-16 DIAGNOSIS — I10 Essential (primary) hypertension: Secondary | ICD-10-CM | POA: Diagnosis not present

## 2021-03-16 NOTE — Patient Instructions (Signed)
Medication Instructions:  Your physician recommends that you continue on your current medications as directed. Please refer to the Current Medication list given to you today.  *If you need a refill on your cardiac medications before your next appointment, please call your pharmacy*   Lab Work: Your physician recommends that you return for lab work: TODAY: BMET, Mag, HbA1C If you have labs (blood work) drawn today and your tests are completely normal, you will receive your results only by: Marland Kitchen MyChart Message (if you have MyChart) OR . A paper copy in the mail If you have any lab test that is abnormal or we need to change your treatment, we will call you to review the results.   Testing/Procedures: None   Follow-Up: At [facility], you and your health needs are our priority.  As part of our continuing mission to provide you with exceptional heart care, we have created designated Provider Care Teams.  These Care Teams include your primary Cardiologist (physician) and Advanced Practice Providers (APPs -  Physician Assistants and Nurse Practitioners) who all work together to provide you with the care you need, when you need it.  We recommend signing up for the patient portal called "MyChart".  Sign up information is provided on this After Visit Summary.  MyChart is used to connect with patients for Virtual Visits (Telemedicine).  Patients are able to view lab/test results, encounter notes, upcoming appointments, etc.  Non-urgent messages can be sent to your provider as well.   To learn more about what you can do with MyChart, go to [email or web address].    Your next appointment:   4 week(s)  The format for your next appointment:   In Person  Provider:   Berniece Salines, DO   Other Instructions

## 2021-03-16 NOTE — Progress Notes (Signed)
Cardiology Office Note:    Date:  03/16/2021   ID:  Kelli Wagner, DOB [birth year], MRN [id number]  PCP:  Serita Grammes, MD  Cardiologist:  Berniece Salines, DO  Electrophysiologist:  None   Referring MD: Serita Grammes, MD   I am doing well  History of Present Illness:    Kelli Wagner is a 75 y.o. female with a hx of coronary artery disease, obesity, hyperlipidemia, prediabetes, hypertension who is here today for follow-up visit.  I saw the patient December 2021 at that time she was experiencing chest pain we did a coronary CTA which showed coronary artery disease.  In addition an echocardiogram was done which showed diastolic dysfunction.  Since her visit she has been doing well essentially taking aspirin and Crestor.   Past Medical History:  Diagnosis Date  . Allergy   . Anemia   . Anxiety    situational due to death of husband   . Bursitis   . Cataract    forming   . History of diverticulitis    with hemorrhage (recurrent)   . Hx of Clostridium difficile infection   . Hypercholesteremia   . Hypertension    elevated since death of husband May 15, 2019    Past Surgical History:  Procedure Laterality Date  . ABDOMINAL HYSTERECTOMY    . CHOLECYSTECTOMY    . COLONOSCOPY  04/30/2014   Colonic polyps status post polypectomy. Otherwise normal postoperative colonoscopy.   Marland Kitchen KIDNEY SURGERY    . Laproscopic sigmoid colon resection with primary anastomosis    . POLYPECTOMY    . TONSILLECTOMY      Current Medications: Current Meds  Medication Sig  . aspirin EC 81 MG tablet Take 1 tablet (81 mg total) by mouth daily. Swallow whole.  Marland Kitchen azelastine (ASTELIN) 0.1 % nasal spray Place into both nostrils 2 (two) times daily. Use in each nostril as directed  . calcium-vitamin D (OSCAL WITH D) 500-200 MG-UNIT tablet Take 1 tablet by mouth daily with breakfast.  . cholecalciferol (VITAMIN D3) 25 MCG (1000 UT) tablet Take 2,000 Units by mouth daily.  Marland Kitchen dicyclomine (BENTYL) 10 MG  capsule TAKE ONE CAPSULE BY MOUTH TWICE A DAY  . ferrous sulfate 325 (65 FE) MG tablet Take 325 mg by mouth daily with breakfast.  . fexofenadine (ALLEGRA) 180 MG tablet Take 180 mg by mouth daily.  . meloxicam (MOBIC) 15 MG tablet Take 15 mg by mouth daily as needed for pain.  . metoprolol tartrate (LOPRESSOR) 100 MG tablet Take 1 tablet (100 mg total) by mouth as directed. Take 1 tablet by mouth 2 hours before CT  . montelukast (SINGULAIR) 10 MG tablet Take 10 mg by mouth at bedtime.  . Multiple Vitamins-Minerals (WOMENS 50+ MULTI VITAMIN/MIN PO) Take 1 capsule by mouth daily.  Marland Kitchen OVER THE COUNTER MEDICATION 2 Tumeric with pepper daily at night, 1 in the morning  . Rosuvastatin Calcium 20 MG CPSP Take 10 mg by mouth daily at 6 PM.   . vitamin C (ASCORBIC ACID) 500 MG tablet Take 500 mg by mouth daily.     Allergies:   Patient has no known allergies.   Social History   Socioeconomic History  . Marital status: Married    Spouse name: Not on file  . Number of children: Not on file  . Years of education: Not on file  . Highest education level: Not on file  Occupational History  . Not on file  Tobacco Use  . Smoking status: Former  Smoker  . Smokeless tobacco: Never Used  Vaping Use  . Vaping Use: Never used  Substance and Sexual Activity  . Alcohol use: Not Currently    Comment: quit 1968  . Drug use: Never  . Sexual activity: Not on file  Other Topics Concern  . Not on file  Social History Narrative  . Not on file   Social Determinants of Health   Financial Resource Strain: Not on file  Food Insecurity: Not on file  Transportation Needs: Not on file  Physical Activity: Not on file  Stress: Not on file  Social Connections: Not on file     Family History: The patient's family history includes CAD in her mother; Heart failure in her father. There is no history of Colon cancer, Colon polyps, Esophageal cancer, Rectal cancer, or Stomach cancer.  ROS:   Review of Systems   Constitution: Negative for decreased appetite, fever and weight gain.  HENT: Negative for congestion, ear discharge, hoarse voice and sore throat.   Eyes: Negative for discharge, redness, vision loss in right eye and visual halos.  Cardiovascular: Negative for chest pain, dyspnea on exertion, leg swelling, orthopnea and palpitations.  Respiratory: Negative for cough, hemoptysis, shortness of breath and snoring.   Endocrine: Negative for heat intolerance and polyphagia.  Hematologic/Lymphatic: Negative for bleeding problem. Does not bruise/bleed easily.  Skin: Negative for flushing, nail changes, rash and suspicious lesions.  Musculoskeletal: Negative for arthritis, joint pain, muscle cramps, myalgias, neck pain and stiffness.  Gastrointestinal: Negative for abdominal pain, bowel incontinence, diarrhea and excessive appetite.  Genitourinary: Negative for decreased libido, genital sores and incomplete emptying.  Neurological: Negative for brief paralysis, focal weakness, headaches and loss of balance.  Psychiatric/Behavioral: Negative for altered mental status, depression and suicidal ideas.  Allergic/Immunologic: Negative for HIV exposure and persistent infections.    EKGs/Labs/Other Studies Reviewed:    The following studies were reviewed today:   EKG: None today  Recent Labs: 01/12/2021: BUN 15; Creatinine, Ser 0.76; Potassium 4.3; Sodium 142  Recent Lipid Panel No results found for: CHOL, TRIG, HDL, CHOLHDL, VLDL, LDLCALC, LDLDIRECT  Physical Exam:    VS:  BP (!) 164/92 (BP Location: Left Arm)   Pulse 70   Ht 5' (1.524 m)   Wt 162 lb 9.6 oz (73.8 kg)   SpO2 97%   BMI 31.76 kg/m     Wt Readings from Last 3 Encounters:  03/16/21 162 lb 9.6 oz (73.8 kg)  11/26/20 172 lb 6.4 oz (78.2 kg)  08/14/19 158 lb 2 oz (71.7 kg)     GEN: Well nourished, well developed in no acute distress HEENT: Normal NECK: No JVD; No carotid bruits LYMPHATICS: No lymphadenopathy CARDIAC: S1S2  noted,RRR, no murmurs, rubs, gallops RESPIRATORY:  Clear to auscultation without rales, wheezing or rhonchi  ABDOMEN: Soft, non-tender, non-distended, +bowel sounds, no guarding. EXTREMITIES: No edema, No cyanosis, no clubbing MUSCULOSKELETAL:  No deformity  SKIN: Warm and dry NEUROLOGIC:  Alert and oriented x 3, non-focal PSYCHIATRIC:  Normal affect, good insight  ASSESSMENT:    1. Hypertension, unspecified type   2. Prediabetes   3. Coronary artery disease involving native coronary artery of native heart, unspecified whether angina present   4. Hyperlipidemia, unspecified hyperlipidemia type    PLAN:     She is hypertensive in the office today.  She tells me that recently she has been eating some food with increased salt.  She plans to cut back on it.  Also asked the patient that she  take her blood pressure daily.  Plan to see her in 4 weeks to compare her blood pressure should she be elevated she will need to be started on antihypertensive medication.  In terms of her coronary artery disease she is stable no angina symptoms.  She will continue on her aspirin and statin.  Hyperlipidemia - continue with current statin medication.  The patient understands the need to lose weight with diet and exercise. We have discussed specific strategies for this.  Her most recent hemoglobin A1c was 6.0 in February 2022 I have shared this with the patient however she is requesting to get a hemoglobin A1c today.  We will do that as well.   The patient is in agreement with the above plan. The patient left the office in stable condition.  The patient will follow up in 4 weeks due to blood pressure follow-up.   Medication Adjustments/Labs and Tests Ordered: Current medicines are reviewed at length with the patient today.  Concerns regarding medicines are outlined above.  Orders Placed This Encounter  Procedures  . Basic metabolic panel  . Magnesium  . Hemoglobin A1c   No orders of the defined  types were placed in this encounter.   Patient Instructions  Medication Instructions:  Your physician recommends that you continue on your current medications as directed. Please refer to the Current Medication list given to you today.  *If you need a refill on your cardiac medications before your next appointment, please call your pharmacy*   Lab Work: Your physician recommends that you return for lab work: TODAY: BMET, Mag, HbA1C If you have labs (blood work) drawn today and your tests are completely normal, you will receive your results only by: Marland Kitchen MyChart Message (if you have MyChart) OR . A paper copy in the mail If you have any lab test that is abnormal or we need to change your treatment, we will call you to review the results.   Testing/Procedures: None   Follow-Up: At [facility], you and your health needs are our priority.  As part of our continuing mission to provide you with exceptional heart care, we have created designated Provider Care Teams.  These Care Teams include your primary Cardiologist (physician) and Advanced Practice Providers (APPs -  Physician Assistants and Nurse Practitioners) who all work together to provide you with the care you need, when you need it.  We recommend signing up for the patient portal called "MyChart".  Sign up information is provided on this After Visit Summary.  MyChart is used to connect with patients for Virtual Visits (Telemedicine).  Patients are able to view lab/test results, encounter notes, upcoming appointments, etc.  Non-urgent messages can be sent to your provider as well.   To learn more about what you can do with MyChart, go to [email or web address].    Your next appointment:   4 week(s)  The format for your next appointment:   In Person  Provider:   Berniece Salines, DO   Other Instructions      Adopting a Healthy Lifestyle.  Know what a healthy weight is for you (roughly BMI <25) and aim to maintain this    Aim for 7+ servings of fruits and vegetables daily   65-80+ fluid ounces of water or unsweet tea for healthy kidneys   Limit to max 1 drink of alcohol per day; avoid smoking/tobacco   Limit animal fats in diet for cholesterol and heart health - choose grass fed whenever available   Avoid highly  processed foods, and foods high in saturated/trans fats   Aim for low stress - take time to unwind and care for your mental health   Aim for 150 min of moderate intensity exercise weekly for heart health, and weights twice weekly for bone health   Aim for 7-9 hours of sleep daily   When it comes to diets, agreement about the perfect plan isnt easy to find, even among the experts. Experts at the [employer] developed an idea known as the Healthy Eating Plate. Just imagine a plate divided into logical, healthy portions.   The emphasis is on diet quality:   Load up on vegetables and fruits - one-half of your plate: Aim for color and variety, and remember that potatoes dont count.   Go for whole grains - one-quarter of your plate: Whole wheat, barley, wheat berries, quinoa, oats, brown rice, and foods made with them. If you want pasta, go with whole wheat pasta.   Protein power - one-quarter of your plate: Fish, chicken, beans, and nuts are all healthy, versatile protein sources. Limit red meat.   The diet, however, does go beyond the plate, offering a few other suggestions.   Use healthy plant oils, such as olive, canola, soy, corn, sunflower and peanut. Check the labels, and avoid partially hydrogenated oil, which have unhealthy trans fats.   If youre thirsty, drink water. Coffee and tea are good in moderation, but skip sugary drinks and limit milk and dairy products to one or two daily servings.   The type of carbohydrate in the diet is more important than the amount. Some sources of carbohydrates, such as vegetables, fruits, whole grains, and beans-are healthier than  others.   Finally, stay active  Signed, Berniece Salines, DO  03/16/2021 1:58 PM    [facility]

## 2021-03-17 ENCOUNTER — Telehealth: Payer: Self-pay | Admitting: Cardiology

## 2021-03-17 LAB — BASIC METABOLIC PANEL
BUN/Creatinine Ratio: 22 (ref 12–28)
BUN: 17 mg/dL (ref 8–27)
CO2: 22 mmol/L (ref 20–29)
Calcium: 9.9 mg/dL (ref 8.7–10.3)
Chloride: 100 mmol/L (ref 96–106)
Creatinine, Ser: 0.76 mg/dL (ref 0.57–1.00)
Glucose: 79 mg/dL (ref 65–99)
Potassium: 4.1 mmol/L (ref 3.5–5.2)
Sodium: 142 mmol/L (ref 134–144)
eGFR: 83 mL/min/{1.73_m2} (ref >59)

## 2021-03-17 LAB — MAGNESIUM: Magnesium: 1.9 mg/dL (ref 1.6–2.3)

## 2021-03-17 LAB — HEMOGLOBIN A1C
Est. average glucose Bld gHb Est-mCnc: 126 mg/dL
Hgb A1c MFr Bld: 6 % — ABNORMAL HIGH (ref 4.8–5.6)

## 2021-03-17 NOTE — Telephone Encounter (Signed)
Kelli Wagner is calling requesting her lab results. She states she was unable to find her hemoglobin A1c results on her MyChart. Please advise.

## 2021-03-18 NOTE — Telephone Encounter (Signed)
Spoke with patient. See telephone note.

## 2021-04-04 ENCOUNTER — Other Ambulatory Visit: Payer: Self-pay | Admitting: Gastroenterology

## 2021-04-04 DIAGNOSIS — K594 Anal spasm: Secondary | ICD-10-CM

## 2021-04-13 ENCOUNTER — Ambulatory Visit: Payer: Medicare PPO | Admitting: Cardiology

## 2021-04-22 ENCOUNTER — Ambulatory Visit (INDEPENDENT_AMBULATORY_CARE_PROVIDER_SITE_OTHER): Payer: Medicare PPO | Admitting: Cardiology

## 2021-04-22 ENCOUNTER — Encounter: Payer: Self-pay | Admitting: Cardiology

## 2021-04-22 ENCOUNTER — Other Ambulatory Visit: Payer: Self-pay

## 2021-04-22 VITALS — BP 160/90 | HR 69 | Ht 60.0 in | Wt 157.8 lb

## 2021-04-22 DIAGNOSIS — E669 Obesity, unspecified: Secondary | ICD-10-CM | POA: Diagnosis not present

## 2021-04-22 DIAGNOSIS — I10 Essential (primary) hypertension: Secondary | ICD-10-CM

## 2021-04-22 DIAGNOSIS — E782 Mixed hyperlipidemia: Secondary | ICD-10-CM

## 2021-04-22 MED ORDER — AMLODIPINE BESYLATE 2.5 MG PO TABS: 2.5000 mg | ORAL_TABLET | Freq: Every day | ORAL | 3 refills | Status: AC

## 2021-04-22 NOTE — Progress Notes (Signed)
Cardiology Office Note:    Date:  04/22/2021   ID:  Kelli Wagner, DOB [birth year], MRN [id number]  PCP:  Serita Grammes, MD  Cardiologist:  Berniece Salines, DO  Electrophysiologist:  None   Referring MD: Serita Grammes, MD   I do not know what is happening with my blood pressure  History of Present Illness:    Kelli Wagner is a 75 y.o. female with a hx of coronary artery disease seen on coronary CTA, obesity, hyperlipidemia, prediabetes, hypertension is here today for follow-up visit.   I saw the patient December 2021 at that time she was experiencing chest pain we did a coronary CTA which showed coronary artery disease.  In addition an echocardiogram was done which showed diastolic dysfunction.  At her last visit on March 16, 2021 at that time she was hypertensive but the patient did not want to start antihypertensive medication.  So I had her take her blood pressure daily and come for follow-up visit.  She did come with her blood pressure readings which show averaging in the 062I and 948N systolic.  She denies any chest pain  Past Medical History:  Diagnosis Date  . Allergy   . Anemia   . Anxiety    situational due to death of husband   . Bursitis   . Cataract    forming   . History of diverticulitis    with hemorrhage (recurrent)   . Hx of Clostridium difficile infection   . Hypercholesteremia   . Hypertension    elevated since death of husband 05-05-2019    Past Surgical History:  Procedure Laterality Date  . ABDOMINAL HYSTERECTOMY    . CHOLECYSTECTOMY    . COLONOSCOPY  04/30/2014   Colonic polyps status post polypectomy. Otherwise normal postoperative colonoscopy.   Marland Kitchen KIDNEY SURGERY    . Laproscopic sigmoid colon resection with primary anastomosis    . POLYPECTOMY    . TONSILLECTOMY      Current Medications: Current Meds  Medication Sig  . amLODipine (NORVASC) 2.5 MG tablet Take 1 tablet (2.5 mg total) by mouth daily.  Marland Kitchen aspirin EC 81 MG tablet Take 1  tablet (81 mg total) by mouth daily. Swallow whole.  Marland Kitchen azelastine (ASTELIN) 0.1 % nasal spray Place into both nostrils 2 (two) times daily. Use in each nostril as directed  . bismuth subsalicylate (PEPTO-BISMOL) 262 MG/15ML suspension Take 30 mLs by mouth every 6 (six) hours as needed for indigestion or diarrhea or loose stools.  . calcium-vitamin D (OSCAL WITH D) 500-200 MG-UNIT tablet Take 1 tablet by mouth daily with breakfast.  . Cetirizine HCl (ALLERGY RELIEF CETIRIZINE PO) Take 1 tablet by mouth daily as needed (allergies).  . cholecalciferol (VITAMIN D3) 25 MCG (1000 UT) tablet Take 2,000 Units by mouth daily.  Marland Kitchen CINNAMON PO Take 1 tablet by mouth daily.  . diclofenac Sodium (VOLTAREN) 1 % GEL Apply 2 g topically daily as needed (pain).  Marland Kitchen diclofenac Sodium (VOLTAREN) 1 % GEL Apply 2 g topically daily as needed (pain).  Marland Kitchen dicyclomine (BENTYL) 10 MG capsule TAKE ONE CAPSULE BY MOUTH TWICE A DAY  . ELDERBERRY PO Take 1 tablet by mouth 4 (four) times a week. Strength unknown  . fexofenadine (ALLEGRA) 180 MG tablet Take 180 mg by mouth daily.  . Flaxseed, Linseed, (FLAX SEEDS PO) Take 1 applicator by mouth in the morning and at bedtime. 2 tablespoons a day  . ibuprofen (ADVIL) 200 MG tablet Take 200 mg by mouth every  6 (six) hours as needed for mild pain or moderate pain.  . meclizine (ANTIVERT) 25 MG tablet Take 25 mg by mouth 3 (three) times daily as needed for dizziness.  . meloxicam (MOBIC) 15 MG tablet Take 15 mg by mouth daily as needed for pain.  . montelukast (SINGULAIR) 10 MG tablet Take 10 mg by mouth at bedtime.  . Multiple Vitamins-Minerals (WOMENS 50+ MULTI VITAMIN/MIN PO) Take 1 capsule by mouth daily.  Mable Fill (EYE ALLERGY RELIEF OP) Apply 2 drops to eye daily as needed (watery eyes).  . Omega-3 Fatty Acids (FISH OIL PO) Take 1,576 mg by mouth 3 (three) times a week.  Marland Kitchen OVER THE COUNTER MEDICATION 2 Tumeric with pepper daily at night, 1 in the morning  .  Phenylephrine-Ibuprofen (CONGESTION RELIEF PO) Take 1 spray by mouth daily as needed (congestion relief). Strength unknown  . Rosuvastatin Calcium 20 MG CPSP Take 10 mg by mouth daily at 6 PM.   . vitamin C (ASCORBIC ACID) 500 MG tablet Take 500 mg by mouth daily.     Allergies:   Patient has no known allergies.   Social History   Socioeconomic History  . Marital status: Married    Spouse name: Not on file  . Number of children: Not on file  . Years of education: Not on file  . Highest education level: Not on file  Occupational History  . Not on file  Tobacco Use  . Smoking status: Former Research scientist (life sciences)  . Smokeless tobacco: Never Used  Vaping Use  . Vaping Use: Never used  Substance and Sexual Activity  . Alcohol use: Not Currently    Comment: quit 1968  . Drug use: Never  . Sexual activity: Not on file  Other Topics Concern  . Not on file  Social History Narrative  . Not on file   Social Determinants of Health   Financial Resource Strain: Not on file  Food Insecurity: Not on file  Transportation Needs: Not on file  Physical Activity: Not on file  Stress: Not on file  Social Connections: Not on file     Family History: The patient's family history includes CAD in her mother; Heart failure in her father. There is no history of Colon cancer, Colon polyps, Esophageal cancer, Rectal cancer, or Stomach cancer.  ROS:   Review of Systems  Constitution: Negative for decreased appetite, fever and weight gain.  HENT: Negative for congestion, ear discharge, hoarse voice and sore throat.   Eyes: Negative for discharge, redness, vision loss in right eye and visual halos.  Cardiovascular: Negative for chest pain, dyspnea on exertion, leg swelling, orthopnea and palpitations.  Respiratory: Negative for cough, hemoptysis, shortness of breath and snoring.   Endocrine: Negative for heat intolerance and polyphagia.  Hematologic/Lymphatic: Negative for bleeding problem. Does not  bruise/bleed easily.  Skin: Negative for flushing, nail changes, rash and suspicious lesions.  Musculoskeletal: Negative for arthritis, joint pain, muscle cramps, myalgias, neck pain and stiffness.  Gastrointestinal: Negative for abdominal pain, bowel incontinence, diarrhea and excessive appetite.  Genitourinary: Negative for decreased libido, genital sores and incomplete emptying.  Neurological: Negative for brief paralysis, focal weakness, headaches and loss of balance.  Psychiatric/Behavioral: Negative for altered mental status, depression and suicidal ideas.  Allergic/Immunologic: Negative for HIV exposure and persistent infections.    EKGs/Labs/Other Studies Reviewed:    The following studies were reviewed today:   EKG: None today Recent Labs: 03/16/2021: BUN 17; Creatinine, Ser 0.76; Magnesium 1.9; Potassium 4.1; Sodium 142  Recent Lipid Panel No results found for: CHOL, TRIG, HDL, CHOLHDL, VLDL, LDLCALC, LDLDIRECT  Physical Exam:    VS:  BP (!) 160/90   Pulse 69   Ht 5' (1.524 m)   Wt 157 lb 12.8 oz (71.6 kg)   SpO2 98%   BMI 30.82 kg/m     Wt Readings from Last 3 Encounters:  04/22/21 157 lb 12.8 oz (71.6 kg)  03/16/21 162 lb 9.6 oz (73.8 kg)  11/26/20 172 lb 6.4 oz (78.2 kg)     GEN: Well nourished, well developed in no acute distress HEENT: Normal NECK: No JVD; No carotid bruits LYMPHATICS: No lymphadenopathy CARDIAC: S1S2 noted,RRR, no murmurs, rubs, gallops RESPIRATORY:  Clear to auscultation without rales, wheezing or rhonchi  ABDOMEN: Soft, non-tender, non-distended, +bowel sounds, no guarding. EXTREMITIES: No edema, No cyanosis, no clubbing MUSCULOSKELETAL:  No deformity  SKIN: Warm and dry NEUROLOGIC:  Alert and oriented x 3, non-focal PSYCHIATRIC:  Normal affect, good insight  ASSESSMENT:    1. Mixed hyperlipidemia   2. Obesity (BMI 30-39.9)   3. Hypertension, unspecified type    PLAN:     1.  She is hypertensive in the office today I talked  to the patient by standing antihypertensive medication she is hesitant but is willing to do so.  Started on amlodipine 2.5 mg daily.  She is agreeable to do that.  2.  No anginal report during her visit and she will continue on her aspirin and statin.  3.  The patient understands the need to lose weight with diet and exercise. We have discussed specific strategies for this.  The patient is in agreement with the above plan. The patient left the office in stable condition.  The patient will follow up in 8 weeks.   Medication Adjustments/Labs and Tests Ordered: Current medicines are reviewed at length with the patient today.  Concerns regarding medicines are outlined above.  No orders of the defined types were placed in this encounter.  Meds ordered this encounter  Medications  . amLODipine (NORVASC) 2.5 MG tablet    Sig: Take 1 tablet (2.5 mg total) by mouth daily.    Dispense:  90 tablet    Refill:  3    Patient Instructions  Medication Instructions:  Your physician has recommended you make the following change in your medication:  START: Amlodipine 2.5 mg once daily *If you need a refill on your cardiac medications before your next appointment, please call your pharmacy*   Lab Work: None If you have labs (blood work) drawn today and your tests are completely normal, you will receive your results only by: Marland Kitchen MyChart Message (if you have MyChart) OR . A paper copy in the mail If you have any lab test that is abnormal or we need to change your treatment, we will call you to review the results.   Testing/Procedures: None   Follow-Up: At [facility], you and your health needs are our priority.  As part of our continuing mission to provide you with exceptional heart care, we have created designated Provider Care Teams.  These Care Teams include your primary Cardiologist (physician) and Advanced Practice Providers (APPs -  Physician Assistants and Nurse Practitioners) who all work  together to provide you with the care you need, when you need it.  We recommend signing up for the patient portal called "MyChart".  Sign up information is provided on this After Visit Summary.  MyChart is used to connect with patients for Virtual Visits (  Telemedicine).  Patients are able to view lab/test results, encounter notes, upcoming appointments, etc.  Non-urgent messages can be sent to your provider as well.   To learn more about what you can do with MyChart, go to [email or web address].    Your next appointment:   8 week(s)  The format for your next appointment:   In Person  Provider:   Berniece Salines, DO   Other Instructions      Adopting a Healthy Lifestyle.  Know what a healthy weight is for you (roughly BMI <25) and aim to maintain this   Aim for 7+ servings of fruits and vegetables daily   65-80+ fluid ounces of water or unsweet tea for healthy kidneys   Limit to max 1 drink of alcohol per day; avoid smoking/tobacco   Limit animal fats in diet for cholesterol and heart health - choose grass fed whenever available   Avoid highly processed foods, and foods high in saturated/trans fats   Aim for low stress - take time to unwind and care for your mental health   Aim for 150 min of moderate intensity exercise weekly for heart health, and weights twice weekly for bone health   Aim for 7-9 hours of sleep daily   When it comes to diets, agreement about the perfect plan isnt easy to find, even among the experts. Experts at the [employer] developed an idea known as the Healthy Eating Plate. Just imagine a plate divided into logical, healthy portions.   The emphasis is on diet quality:   Load up on vegetables and fruits - one-half of your plate: Aim for color and variety, and remember that potatoes dont count.   Go for whole grains - one-quarter of your plate: Whole wheat, barley, wheat berries, quinoa, oats, brown rice, and foods made with  them. If you want pasta, go with whole wheat pasta.   Protein power - one-quarter of your plate: Fish, chicken, beans, and nuts are all healthy, versatile protein sources. Limit red meat.   The diet, however, does go beyond the plate, offering a few other suggestions.   Use healthy plant oils, such as olive, canola, soy, corn, sunflower and peanut. Check the labels, and avoid partially hydrogenated oil, which have unhealthy trans fats.   If youre thirsty, drink water. Coffee and tea are good in moderation, but skip sugary drinks and limit milk and dairy products to one or two daily servings.   The type of carbohydrate in the diet is more important than the amount. Some sources of carbohydrates, such as vegetables, fruits, whole grains, and beans-are healthier than others.   Finally, stay active  Signed, Berniece Salines, DO  04/22/2021 1:14 PM    [facility]

## 2021-04-22 NOTE — Patient Instructions (Signed)
Medication Instructions:  Your physician has recommended you make the following change in your medication:  START: Amlodipine 2.5 mg once daily *If you need a refill on your cardiac medications before your next appointment, please call your pharmacy*   Lab Work: None If you have labs (blood work) drawn today and your tests are completely normal, you will receive your results only by: Marland Kitchen MyChart Message (if you have MyChart) OR . A paper copy in the mail If you have any lab test that is abnormal or we need to change your treatment, we will call you to review the results.   Testing/Procedures: None   Follow-Up: At [facility], you and your health needs are our priority.  As part of our continuing mission to provide you with exceptional heart care, we have created designated Provider Care Teams.  These Care Teams include your primary Cardiologist (physician) and Advanced Practice Providers (APPs -  Physician Assistants and Nurse Practitioners) who all work together to provide you with the care you need, when you need it.  We recommend signing up for the patient portal called "MyChart".  Sign up information is provided on this After Visit Summary.  MyChart is used to connect with patients for Virtual Visits (Telemedicine).  Patients are able to view lab/test results, encounter notes, upcoming appointments, etc.  Non-urgent messages can be sent to your provider as well.   To learn more about what you can do with MyChart, go to [email or web address].    Your next appointment:   8 week(s)  The format for your next appointment:   In Person  Provider:   Berniece Salines, DO   Other Instructions

## 2021-04-27 ENCOUNTER — Ambulatory Visit (INDEPENDENT_AMBULATORY_CARE_PROVIDER_SITE_OTHER): Payer: Medicare PPO | Admitting: Gastroenterology

## 2021-04-27 ENCOUNTER — Encounter: Payer: Self-pay | Admitting: Gastroenterology

## 2021-04-27 ENCOUNTER — Other Ambulatory Visit: Payer: Self-pay

## 2021-04-27 VITALS — BP 142/92 | HR 67 | Ht 60.0 in | Wt 161.1 lb

## 2021-04-27 DIAGNOSIS — Z8719 Personal history of other diseases of the digestive system: Secondary | ICD-10-CM

## 2021-04-27 DIAGNOSIS — Z86010 Personal history of colonic polyps: Secondary | ICD-10-CM

## 2021-04-27 DIAGNOSIS — R1030 Lower abdominal pain, unspecified: Secondary | ICD-10-CM

## 2021-04-27 DIAGNOSIS — R197 Diarrhea, unspecified: Secondary | ICD-10-CM

## 2021-04-27 MED ORDER — DICYCLOMINE HCL 10 MG PO CAPS: 10.0000 mg | ORAL_CAPSULE | Freq: Two times a day (BID) | ORAL | 4 refills | Status: DC

## 2021-04-27 NOTE — Progress Notes (Signed)
Chief Complaint: Abdominal pain  Referring Provider:  Serita Grammes, MD      ASSESSMENT AND PLAN;   #1. Lower abdominal pain (resolved). Neg CT AP Jan 2020, Neg colon 05/2019 except for small tubular adenoma, mild new sigmoid diverticulosis. EGD 05/2019 small transient HH, mild gastritis, neg SB Bx and HP.  #2. Diarrhea.  Likely d/t medicines.  Could have element of postchole diarrhea.  #3. H/O diverticulitis s/p lap sigmoid resection 02/2011  #3.  H/O colonic polyps (TA) 05/2019. Rpt 05/2024  Plan: - Bentyl 10mg  po bid #180, 4 refills - FU PRN  HPI:    Kelli Wagner is a 75 y.o. female   FU  Doing much better.  Occasional diarrhea and abdominal discomfort well controlled with as needed Bentyl.  Here to get prescription refilled.  Had an episode of gastroenteritis 2 months ago with nausea/vomiting/diarrhea.  No problems since.  She does sleep well.  No upper GI symptoms.   SH-husband passed away 05/12/19.  Had kidney problems, DM and was on hospice before he passed away.  He was a patient of ours.  Wt Readings from Last 3 Encounters:  04/27/21 161 lb 2 oz (73.1 kg)  04/22/21 157 lb 12.8 oz (71.6 kg)  03/16/21 162 lb 9.6 oz (73.8 kg)     Past Medical History:  Diagnosis Date  . Allergy   . Anemia   . Anxiety    situational due to death of husband   . Bursitis   . Cataract    forming   . History of diverticulitis    with hemorrhage (recurrent)   . Hx of Clostridium difficile infection   . Hypercholesteremia   . Hypertension    elevated since death of husband 12-May-2019    Past Surgical History:  Procedure Laterality Date  . ABDOMINAL HYSTERECTOMY    . CHOLECYSTECTOMY    . COLONOSCOPY  04/30/2014   Colonic polyps status post polypectomy. Otherwise normal postoperative colonoscopy.   Marland Kitchen KIDNEY SURGERY    . Laproscopic sigmoid colon resection with primary anastomosis    . POLYPECTOMY    . TONSILLECTOMY      Family History  Problem Relation Age  of Onset  . CAD Mother   . Heart failure Father   . Colon cancer Neg Hx   . Colon polyps Neg Hx   . Esophageal cancer Neg Hx   . Rectal cancer Neg Hx   . Stomach cancer Neg Hx     Social History   Tobacco Use  . Smoking status: Former Research scientist (life sciences)  . Smokeless tobacco: Never Used  Vaping Use  . Vaping Use: Never used  Substance Use Topics  . Alcohol use: Not Currently    Comment: quit 1968  . Drug use: Never    No Known Allergies  Review of Systems:  neg     Physical Exam:    BP (!) 142/92 (BP Location: Left Arm, Patient Position: Sitting, Cuff Size: Normal)   Pulse 67   Ht 5' (1.524 m)   Wt 161 lb 2 oz (73.1 kg)   BMI 31.47 kg/m  Filed Weights   04/27/21 1056  Weight: 161 lb 2 oz (73.1 kg)   Constitutional:  Well-developed, in no acute distress. Psychiatric: Normal mood and affect. Behavior is normal. HEENT: Pupils normal.  Conjunctivae are normal. No scleral icterus. Cardiovascular: Normal rate, regular rhythm. No edema Pulmonary/chest: Effort normal and breath sounds normal. No wheezing, rales or rhonchi. Abdominal: Soft, nondistended.  Mild lower abdominal tenderness, no rebound, bowel sounds active throughout. There are no masses palpable. No hepatomegaly.  Rectal:  defered Neurological: Alert and oriented to person place and time. Skin: Skin is warm and dry. No rashes noted.    Carmell Austria, MD 04/27/2021, 11:25 AM  Cc: Serita Grammes, MD

## 2021-04-27 NOTE — Patient Instructions (Signed)
If you are age 74 or older, your body mass index should be between 23-30. Your Body mass index is 31.47 kg/m. If this is out of the aforementioned range listed, please consider follow up with your Primary Care Provider.  If you are age 97 or younger, your body mass index should be between 19-25. Your Body mass index is 31.47 kg/m. If this is out of the aformentioned range listed, please consider follow up with your Primary Care Provider.   We have sent the following medications to your pharmacy for you to pick up at your convenience: Bentyl  Follow up as needed. Please call with any questions or concerns.  Thank you,  Dr. Jackquline Denmark

## 2021-05-03 ENCOUNTER — Telehealth: Payer: Self-pay

## 2021-05-03 DIAGNOSIS — Z006 Encounter for examination for normal comparison and control in clinical research program: Secondary | ICD-10-CM

## 2021-05-03 NOTE — Telephone Encounter (Signed)
Called patient for 90 day Identify phone call pt stated she is not having anymore cardiac symptoms but did follow up with PCP, I reminded the patient that we would be calling her back around February for a year follow up phone call.  

## 2021-05-17 ENCOUNTER — Telehealth: Payer: Self-pay | Admitting: Gastroenterology

## 2021-05-17 DIAGNOSIS — R7303 Prediabetes: Secondary | ICD-10-CM | POA: Diagnosis not present

## 2021-05-17 DIAGNOSIS — J301 Allergic rhinitis due to pollen: Secondary | ICD-10-CM | POA: Diagnosis not present

## 2021-05-17 DIAGNOSIS — R634 Abnormal weight loss: Secondary | ICD-10-CM | POA: Diagnosis not present

## 2021-05-17 DIAGNOSIS — Z6829 Body mass index (BMI) 29.0-29.9, adult: Secondary | ICD-10-CM | POA: Diagnosis not present

## 2021-05-17 DIAGNOSIS — E782 Mixed hyperlipidemia: Secondary | ICD-10-CM | POA: Diagnosis not present

## 2021-05-17 DIAGNOSIS — I10 Essential (primary) hypertension: Secondary | ICD-10-CM | POA: Diagnosis not present

## 2021-05-17 NOTE — Telephone Encounter (Signed)
Patient called requesting to speak with a nurse is having BM issues and is seeking advise.

## 2021-05-18 NOTE — Telephone Encounter (Signed)
Pt reports she is having bad abdominal pain and cramping and requests to be seen. Pt scheduled to see Nicoletta Ba PA tomorrow at 10:30am. Pt aware of appt.

## 2021-05-18 NOTE — Telephone Encounter (Signed)
Patient calling to follow up on previous message is asking for a call sometime today if possible.

## 2021-05-19 ENCOUNTER — Other Ambulatory Visit (INDEPENDENT_AMBULATORY_CARE_PROVIDER_SITE_OTHER): Payer: Medicare PPO

## 2021-05-19 ENCOUNTER — Ambulatory Visit (INDEPENDENT_AMBULATORY_CARE_PROVIDER_SITE_OTHER): Payer: Medicare PPO | Admitting: Physician Assistant

## 2021-05-19 ENCOUNTER — Encounter: Payer: Self-pay | Admitting: Physician Assistant

## 2021-05-19 VITALS — BP 110/74 | HR 76 | Ht 60.0 in | Wt 159.6 lb

## 2021-05-19 DIAGNOSIS — R1030 Lower abdominal pain, unspecified: Secondary | ICD-10-CM | POA: Diagnosis not present

## 2021-05-19 DIAGNOSIS — R198 Other specified symptoms and signs involving the digestive system and abdomen: Secondary | ICD-10-CM | POA: Diagnosis not present

## 2021-05-19 DIAGNOSIS — K5900 Constipation, unspecified: Secondary | ICD-10-CM

## 2021-05-19 LAB — COMPREHENSIVE METABOLIC PANEL
ALT: 17 U/L (ref 0–35)
AST: 17 U/L (ref 0–37)
Albumin: 4.3 g/dL (ref 3.5–5.2)
Alkaline Phosphatase: 70 U/L (ref 39–117)
BUN: 19 mg/dL (ref 6–23)
CO2: 31 mEq/L (ref 19–32)
Calcium: 9.5 mg/dL (ref 8.4–10.5)
Chloride: 105 mEq/L (ref 96–112)
Creatinine, Ser: 0.74 mg/dL (ref 0.40–1.20)
GFR: 79.92 mL/min (ref >60.00)
Glucose, Bld: 84 mg/dL (ref 70–99)
Potassium: 3.6 mEq/L (ref 3.5–5.1)
Sodium: 143 mEq/L (ref 135–145)
Total Bilirubin: 0.3 mg/dL (ref 0.2–1.2)
Total Protein: 7.3 g/dL (ref 6.0–8.3)

## 2021-05-19 LAB — SEDIMENTATION RATE: Sed Rate: 36 mm/hr — ABNORMAL HIGH (ref 0–30)

## 2021-05-19 LAB — CBC WITH DIFFERENTIAL/PLATELET
Basophils Absolute: 0.1 10*3/uL (ref 0.0–0.1)
Basophils Relative: 1 % (ref 0.0–3.0)
Eosinophils Absolute: 0.2 10*3/uL (ref 0.0–0.7)
Eosinophils Relative: 2.4 % (ref 0.0–5.0)
HCT: 37.3 % (ref 36.0–46.0)
Hemoglobin: 12.4 g/dL (ref 12.0–15.0)
Lymphocytes Relative: 19.9 % (ref 12.0–46.0)
Lymphs Abs: 1.3 10*3/uL (ref 0.7–4.0)
MCHC: 33.3 g/dL (ref 30.0–36.0)
MCV: 86.9 fl (ref 78.0–100.0)
Monocytes Absolute: 0.7 10*3/uL (ref 0.1–1.0)
Monocytes Relative: 10.8 % (ref 3.0–12.0)
Neutro Abs: 4.3 10*3/uL (ref 1.4–7.7)
Neutrophils Relative %: 65.9 % (ref 43.0–77.0)
Platelets: 252 10*3/uL (ref 150.0–400.0)
RBC: 4.29 Mil/uL (ref 3.87–5.11)
RDW: 15.1 % (ref 11.5–15.5)
WBC: 6.5 10*3/uL (ref 4.0–10.5)

## 2021-05-19 NOTE — Progress Notes (Signed)
Subjective:    Patient ID: Kelli Wagner, female    DOB: March 31, 1946, 75 y.o.   MRN: [id number]  HPI Kelli Wagner is a pleasant 75 year old African-American female, established with Dr. Lyndel Safe who comes in today with an episode of acute abdominal pain onset 05/12/2021. Patient was seen by Dr. Lyndel Safe on 04/27/2021, At that time also had resolved mild episode of lower abdominal pain.  It was felt she may have IBS and as been on Bentyl 10 mg p.o. twice daily. Patient has history of sigmoid resection in 2012 secondary to complicated diverticulitis, also with prior history of C. difficile colitis and recurrent diverticulitis. Last colonoscopy June 2020 with removal of a small tubular adenomatous polyp and was noted to have prior end-to-end colocolonic anastomosis in the sigmoid and a single diverticulum in the sigmoid colon.  Patient says her current symptoms started on 05/12/2021 with which she describes as crampy lower abdominal pain which became somewhat progressive and then spread into the upper abdomen.  She had associated rectal pressure and some sensation of urgency for bowel movement but was unable to have a bowel movement or passed any gas for about 3 days.  She did not have any visible abdominal distention or bloating, she had mild nausea without vomiting and did not eat much during that period of time.  She says she started having bowel movements on the third day.  She did see her PCP at that point but was already feeling better.  She then came home took some Advil and said that made her feel much better and she has been significantly improved since. Over the past few months her appetite has been decreased and she has lost weight intentionally as she was diagnosed with prediabetes. At this point over the past couple days her pain is pretty much completely resolved and she is feeling good.  She has been taking the Bentyl as prescribed.  Review of Systems.Pertinent positive and negative review of systems were  noted in the above HPI section.  All other review of systems was otherwise negative.   Outpatient Encounter Medications as of 05/19/2021  Medication Sig   amLODipine (NORVASC) 2.5 MG tablet Take 1 tablet (2.5 mg total) by mouth daily.   aspirin EC 81 MG tablet Take 1 tablet (81 mg total) by mouth daily. Swallow whole.   azelastine (ASTELIN) 0.1 % nasal spray Place into both nostrils 2 (two) times daily. Use in each nostril as directed   bismuth subsalicylate (PEPTO-BISMOL) 262 MG/15ML suspension Take 30 mLs by mouth every 6 (six) hours as needed for indigestion or diarrhea or loose stools.   calcium-vitamin D (OSCAL WITH D) 500-200 MG-UNIT tablet Take 1 tablet by mouth daily with breakfast.   Cetirizine HCl (ALLERGY RELIEF CETIRIZINE PO) Take 1 tablet by mouth daily as needed (allergies).   cholecalciferol (VITAMIN D3) 25 MCG (1000 UT) tablet Take 2,000 Units by mouth daily.   diclofenac Sodium (VOLTAREN) 1 % GEL Apply 2 g topically daily as needed (pain).   dicyclomine (BENTYL) 10 MG capsule Take 1 capsule (10 mg total) by mouth 2 (two) times daily.   ELDERBERRY PO Take 1 tablet by mouth 4 (four) times a week. Strength unknown   fexofenadine (ALLEGRA) 180 MG tablet Take 180 mg by mouth daily.   ibuprofen (ADVIL) 200 MG tablet Take 200 mg by mouth every 6 (six) hours as needed for mild pain or moderate pain.   meclizine (ANTIVERT) 25 MG tablet Take 25 mg by mouth 3 (three)  times daily as needed for dizziness.   meloxicam (MOBIC) 15 MG tablet Take 15 mg by mouth daily as needed for pain.   montelukast (SINGULAIR) 10 MG tablet Take 10 mg by mouth at bedtime.   Multiple Vitamins-Minerals (WOMENS 50+ MULTI VITAMIN/MIN PO) Take 1 capsule by mouth daily.   Naphazoline-Pheniramine (EYE ALLERGY RELIEF OP) Apply 2 drops to eye daily as needed (watery eyes).   Omega-3 Fatty Acids (FISH OIL PO) Take 1,576 mg by mouth 3 (three) times a week.   OVER THE COUNTER MEDICATION 2 Tumeric with pepper daily at night,  1 in the morning   Phenylephrine-Ibuprofen (CONGESTION RELIEF PO) Take 1 spray by mouth daily as needed (congestion relief). Strength unknown   Rosuvastatin Calcium 20 MG CPSP Take 10 mg by mouth daily at 6 PM.    vitamin C (ASCORBIC ACID) 500 MG tablet Take 500 mg by mouth daily.   [DISCONTINUED] CINNAMON PO Take 1 tablet by mouth daily. (Patient not taking: Reported on 05/19/2021)   [DISCONTINUED] diclofenac Sodium (VOLTAREN) 1 % GEL Apply 2 g topically daily as needed (pain). (Patient not taking: Reported on 05/19/2021)   [DISCONTINUED] Flaxseed, Linseed, (FLAX SEEDS PO) Take 1 applicator by mouth in the morning and at bedtime. 2 tablespoons a day (Patient not taking: Reported on 05/19/2021)   No facility-administered encounter medications on file as of 05/19/2021.   No Known Allergies Patient Active Problem List   Diagnosis Date Noted   Other chest pain 11/26/2020   Prediabetes 11/26/2020   Mixed hyperlipidemia 11/26/2020   Obesity (BMI 30-39.9) 11/26/2020   Hypertension    Hypercholesteremia    Hx of Clostridium difficile infection    History of diverticulitis    Cataract    Bursitis    Anxiety    Anemia    Allergy    Acquired hallux rigidus, right 08/01/2018   Social History   Socioeconomic History   Marital status: Widowed    Spouse name: Not on file   Number of children: Not on file   Years of education: Not on file   Highest education level: Not on file  Occupational History   Not on file  Tobacco Use   Smoking status: Former    Pack years: 0.00   Smokeless tobacco: Never  Vaping Use   Vaping Use: Never used  Substance and Sexual Activity   Alcohol use: Not Currently    Comment: quit 1968   Drug use: Never   Sexual activity: Not on file  Other Topics Concern   Not on file  Social History Narrative   Not on file   Social Determinants of Health   Financial Resource Strain: Not on file  Food Insecurity: Not on file  Transportation Needs: Not on file  Physical  Activity: Not on file  Stress: Not on file  Social Connections: Not on file  Intimate Partner Violence: Not on file    Kelli Wagner's family history includes CAD in her mother; Heart failure in her father.      Objective:    Vitals:   05/19/21 1014  BP: 110/74  Pulse: 76  SpO2: 96%    Physical Exam Well-developed well-nourished older AA female  in no acute distress.  Height, Weight, 159 BMI31.1  HEENT; nontraumatic normocephalic, EOMI, PE R LA, sclera anicteric. Oropharynx; not examined Neck; supple, no JVD Cardiovascular; regular rate and rhythm with S1-S2, no murmur rub or gallop Pulmonary; Clear bilaterally Abdomen; soft, there is minimal tenderness in the right mid  quadrant, midline incisional scar, nondistended, no palpable mass or hepatosplenomegaly, bowel sounds are active Rectal; not done today Skin; benign exam, no jaundice rash or appreciable lesions Extremities; no clubbing cyanosis or edema skin warm and dry Neuro/Psych; alert and oriented x4, grossly nonfocal mood and affect appropriate        Assessment & Plan:   #67 75 year old African-American female with acute episode of crampy lower abdominal pain, rectal pressure and inability to have a bowel movement or passed flatus for about 3 days onset 05/12/2021, mild associated nausea.  Symptoms gradually resolved over the past couple of days and she currently just has some very minimal right mid quadrant tenderness. Patient is status post sigmoid resection 5015 for complicated diverticulitis, and at most recent colonoscopy 2020 just had a single diverticulum in the sigmoid colon and patent anastomosis. On exam today do not think she has acute diverticulitis.  Query whether she may have had an episode of low-grade partial small bowel obstruction.  Will rule out other intra-abdominal inflammatory process  #2 history of adenomatous colon polyps-up-to-date with colonoscopy last done June 2020 and indicated for 5 to 7-year  interval follow-up  Plan; CBC with differential, c-Met, sed rate Patient will be scheduled for CT scan of the abdomen and pelvis with IV contrast. Continue Bentyl 10 mg p.o. twice daily. Patient is advised that if she has recurrence of symptoms to call here for advice, start a clear liquid diet, and start MiraLAX 17 g once daily in a glass of water. Further recommendations pending findings of lab and CT.  Adolf Ormiston S Grady Lucci PA-C 05/19/2021   Cc: Serita Grammes, MD

## 2021-05-19 NOTE — Patient Instructions (Addendum)
If you are age 74 or older, your body mass index should be between 23-30. Your Body mass index is 31.17 kg/m. If this is out of the aforementioned range listed, please consider follow up with your Primary Care Provider. __________________________________________________________  The [facility] providers would like to encourage you to use MYCHART to communicate with providers for non-urgent requests or questions.  Due to long hold times on the telephone, sending your provider a message by MYCHART may be a faster and more efficient way to get a response.  Please allow 48 business hours for a response.  Please remember that this is for non-urgent requests.   Your provider has requested that you go to the basement level for lab work before leaving today. Press "B" on the elevator. The lab is located at the first door on the left as you exit the elevator.  CT SCAN:  You have been scheduled for a CT of the abdomen and pelvis at [facility] on 06/03/2021 at 8:30 am. Please arrive @ 8:10 am for registration.   PREP:   Do not eat anything after 4:30 am (4 hours prior to your test)  Drink 1 bottle of contrast @ 6:30 am (2 hours prior to your exam)  Drink 1 bottle of contrast @ 7:30 am (1 hour prior to your exam)   CONTRAST:  You will need to stop by [facility] to pick up 2 bottles of contrast 3 DAYS BEFORE YOUR EXAM. The solution may taste better if refrigerated. DO NOT add ice or dilute the contrast.  Shake well before drinking.   MEDICATIONS:  You may take your medications as prescribed with a small amount of water, if necessary.  The following medications MAY need to be held 48 hours before your exam: METFORMIN, GLUCOPHAGE, GLUCOVANCE, AVANDAMET, RIOMET, FORTAMET, ACTOPLUS MET, JANUMET, GLUMETZA or METAGLIP.    NEED TO RESCHEDULE?   Please call radiology at [phone].  WHY ARE YOU HAVING THIS EXAM?  The purpose of you drinking the oral contrast is to aid in the  visualization of your intestinal tract. The contrast solution may cause some diarrhea. Depending on your individual set of symptoms, you may also receive an intravenous injection of x-ray contrast/dye. Plan on being at [facility] for 45 minutes or longer, depending on the type of exam you are having performed.   Continue Bentyl 10 mg 1 tablet twice daily  If symptoms reoccur try using Miralax 1 capful daily and only have clear liquids. Call the office and ask to speak with Amy Esterwood's nurse of Dr. Gupta's nurse.  Follow up pending the result of your CT or as needed.  Thank you for entrusting me with your care and choosing [facility].  Amy Esterwood, PA-C 

## 2021-06-03 ENCOUNTER — Ambulatory Visit
Admission: RE | Admit: 2021-06-03 | Discharge: 2021-06-03 | Disposition: A | Discharge status: Home / Self Care | Payer: Medicare PPO | Source: Ambulatory Visit | Attending: Physician Assistant | Admitting: Physician Assistant

## 2021-06-03 ENCOUNTER — Other Ambulatory Visit: Payer: Self-pay

## 2021-06-03 DIAGNOSIS — R1030 Lower abdominal pain, unspecified: Secondary | ICD-10-CM

## 2021-06-03 DIAGNOSIS — K5730 Diverticulosis of large intestine without perforation or abscess without bleeding: Secondary | ICD-10-CM | POA: Diagnosis not present

## 2021-06-03 DIAGNOSIS — R110 Nausea: Secondary | ICD-10-CM | POA: Diagnosis not present

## 2021-06-03 DIAGNOSIS — D171 Benign lipomatous neoplasm of skin and subcutaneous tissue of trunk: Secondary | ICD-10-CM | POA: Diagnosis not present

## 2021-06-03 DIAGNOSIS — I700 Atherosclerosis of aorta: Secondary | ICD-10-CM | POA: Diagnosis not present

## 2021-06-03 DIAGNOSIS — K5900 Constipation, unspecified: Secondary | ICD-10-CM

## 2021-06-03 DIAGNOSIS — R198 Other specified symptoms and signs involving the digestive system and abdomen: Secondary | ICD-10-CM

## 2021-06-03 MED ORDER — IOPAMIDOL (ISOVUE-300) INJECTION 61%
100.0000 mL | Freq: Once | INTRAVENOUS | Status: AC | PRN
  Administered 2021-06-03: 100 mL via INTRAVENOUS

## 2021-06-07 ENCOUNTER — Other Ambulatory Visit: Payer: Self-pay

## 2021-06-07 ENCOUNTER — Encounter: Payer: Self-pay | Admitting: Cardiology

## 2021-06-07 ENCOUNTER — Ambulatory Visit (INDEPENDENT_AMBULATORY_CARE_PROVIDER_SITE_OTHER): Payer: Medicare PPO | Admitting: Cardiology

## 2021-06-07 VITALS — BP 144/76 | HR 72 | Ht 60.0 in | Wt 156.6 lb

## 2021-06-07 DIAGNOSIS — E785 Hyperlipidemia, unspecified: Secondary | ICD-10-CM

## 2021-06-07 DIAGNOSIS — I10 Essential (primary) hypertension: Secondary | ICD-10-CM | POA: Diagnosis not present

## 2021-06-07 DIAGNOSIS — R7303 Prediabetes: Secondary | ICD-10-CM | POA: Diagnosis not present

## 2021-06-07 DIAGNOSIS — E669 Obesity, unspecified: Secondary | ICD-10-CM

## 2021-06-07 NOTE — Patient Instructions (Addendum)
Medication Instructions:   Your physician recommends that you continue on your current medications as directed. Please refer to the Current Medication list given to you today.  *If you need a refill on your cardiac medications before your next appointment, please call your pharmacy*   Lab Work: Your physician recommends that you return for lab work in:  4-6 weeks: HgbA1C, Lipids - come fasting  If you have labs (blood work) drawn today and your tests are completely normal, you will receive your results only by: [place] (if you have MyChart) OR A paper copy in the mail If you have any lab test that is abnormal or we need to change your treatment, we will call you to review the results.   Testing/Procedures: None   Follow-Up: At [facility], you and your health needs are our priority.  As part of our continuing mission to provide you with exceptional heart care, we have created designated Provider Care Teams.  These Care Teams include your primary Cardiologist (physician) and Advanced Practice Providers (APPs -  Physician Assistants and Nurse Practitioners) who all work together to provide you with the care you need, when you need it.  We recommend signing up for the patient portal called "MyChart".  Sign up information is provided on this After Visit Summary.  MyChart is used to connect with patients for Virtual Visits (Telemedicine).  Patients are able to view lab/test results, encounter notes, upcoming appointments, etc.  Non-urgent messages can be sent to your provider as well.   To learn more about what you can do with MyChart, go to [email or web address].    Your next appointment:   1 year(s)  The format for your next appointment:   In Person  Provider:   [address]  - Berniece Salines, DO   Other Instructions

## 2021-06-07 NOTE — Progress Notes (Signed)
Cardiology Office Note:    Date:  06/07/2021   ID:  FREADA TWERSKY, DOB 05-10-1946, MRN [id number]  PCP:  Serita Grammes, MD  Cardiologist:  Berniece Salines, DO  Electrophysiologist:  None   Referring MD: Serita Grammes, MD   No chief complaint on file. I am doing okay  History of Present Illness:    Kelli Wagner is a 75 y.o. female with a hx of coronary artery disease seen on coronary CTA, obesity, hyperlipidemia, prediabetes, hypertension is here today for follow-up visit.    I saw the patient December 2021 at that time she was experiencing chest pain we did a coronary CTA which showed coronary artery disease.  In addition an echocardiogram was done which showed diastolic dysfunction.   I saw the patient on March 16, 2021 at that time she was hypertensive but the patient did not want to start antihypertensive medication.  So I had her take her blood pressure daily and come for follow-up visit.  I saw the patient on Apr 22, 2021 at that time she was hypertensive we discussed starting medication she was started on amlodipine 2.5 mg daily.  She is here today for follow-up visit she did bring her blood pressure recordings with her which at home is averaging in the 536R systolic.  Past Medical History:  Diagnosis Date   Allergy    Anemia    Anxiety    situational due to death of husband    Bursitis    Cataract    forming    History of diverticulitis    with hemorrhage (recurrent)    Hx of Clostridium difficile infection    Hypercholesteremia    Hypertension    elevated since death of husband Apr 27, 2019    Past Surgical History:  Procedure Laterality Date   ABDOMINAL HYSTERECTOMY     CHOLECYSTECTOMY     COLONOSCOPY  04/30/2014   Colonic polyps status post polypectomy. Otherwise normal postoperative colonoscopy.    KIDNEY SURGERY     Laproscopic sigmoid colon resection with primary anastomosis     POLYPECTOMY     TONSILLECTOMY      Current Medications: Current Meds   Medication Sig   amLODipine (NORVASC) 2.5 MG tablet Take 1 tablet (2.5 mg total) by mouth daily.   aspirin EC 81 MG tablet Take 1 tablet (81 mg total) by mouth daily. Swallow whole.   azelastine (ASTELIN) 0.1 % nasal spray Place into both nostrils 2 (two) times daily. Use in each nostril as directed   bismuth subsalicylate (PEPTO-BISMOL) 262 MG/15ML suspension Take 30 mLs by mouth every 6 (six) hours as needed for indigestion or diarrhea or loose stools.   calcium-vitamin D (OSCAL WITH D) 500-200 MG-UNIT tablet Take 1 tablet by mouth daily with breakfast.   Cetirizine HCl (ALLERGY RELIEF CETIRIZINE PO) Take 1 tablet by mouth daily as needed (allergies).   cholecalciferol (VITAMIN D3) 25 MCG (1000 UT) tablet Take 2,000 Units by mouth daily.   diclofenac Sodium (VOLTAREN) 1 % GEL Apply 2 g topically daily as needed (pain).   dicyclomine (BENTYL) 10 MG capsule Take 1 capsule (10 mg total) by mouth 2 (two) times daily.   ELDERBERRY PO Take 1 tablet by mouth 4 (four) times a week. Strength unknown   fexofenadine (ALLEGRA) 180 MG tablet Take 180 mg by mouth daily.   ibuprofen (ADVIL) 200 MG tablet Take 200 mg by mouth every 6 (six) hours as needed for mild pain or moderate pain.   meclizine (ANTIVERT)  25 MG tablet Take 25 mg by mouth 3 (three) times daily as needed for dizziness.   meloxicam (MOBIC) 15 MG tablet Take 15 mg by mouth daily as needed for pain.   montelukast (SINGULAIR) 10 MG tablet Take 10 mg by mouth at bedtime.   Multiple Vitamins-Minerals (WOMENS 50+ MULTI VITAMIN/MIN PO) Take 1 capsule by mouth daily.   Naphazoline-Pheniramine (EYE ALLERGY RELIEF OP) Apply 2 drops to eye daily as needed (watery eyes).   Omega-3 Fatty Acids (FISH OIL PO) Take 1,576 mg by mouth 3 (three) times a week.   OVER THE COUNTER MEDICATION 2 Tumeric with pepper daily at night, 1 in the morning   Phenylephrine-Ibuprofen (CONGESTION RELIEF PO) Take 1 spray by mouth daily as needed (congestion relief). Strength  unknown   Rosuvastatin Calcium 20 MG CPSP Take 10 mg by mouth daily at 6 PM.    vitamin C (ASCORBIC ACID) 500 MG tablet Take 500 mg by mouth daily.     Allergies:   Patient has no known allergies.   Social History   Socioeconomic History   Marital status: Widowed    Spouse name: Not on file   Number of children: Not on file   Years of education: Not on file   Highest education level: Not on file  Occupational History   Not on file  Tobacco Use   Smoking status: Former    Pack years: 0.00   Smokeless tobacco: Never  Vaping Use   Vaping Use: Never used  Substance and Sexual Activity   Alcohol use: Not Currently    Comment: quit 1968   Drug use: Never   Sexual activity: Not on file  Other Topics Concern   Not on file  Social History Narrative   Not on file   Social Determinants of Health   Financial Resource Strain: Not on file  Food Insecurity: Not on file  Transportation Needs: Not on file  Physical Activity: Not on file  Stress: Not on file  Social Connections: Not on file     Family History: The patient's family history includes CAD in her mother; Heart failure in her father. There is no history of Colon cancer, Colon polyps, Esophageal cancer, Rectal cancer, or Stomach cancer.  ROS:   Review of Systems  Constitution: Negative for decreased appetite, fever and weight gain.  HENT: Negative for congestion, ear discharge, hoarse voice and sore throat.   Eyes: Negative for discharge, redness, vision loss in right eye and visual halos.  Cardiovascular: Negative for chest pain, dyspnea on exertion, leg swelling, orthopnea and palpitations.  Respiratory: Negative for cough, hemoptysis, shortness of breath and snoring.   Endocrine: Negative for heat intolerance and polyphagia.  Hematologic/Lymphatic: Negative for bleeding problem. Does not bruise/bleed easily.  Skin: Negative for flushing, nail changes, rash and suspicious lesions.  Musculoskeletal: Negative for  arthritis, joint pain, muscle cramps, myalgias, neck pain and stiffness.  Gastrointestinal: Negative for abdominal pain, bowel incontinence, diarrhea and excessive appetite.  Genitourinary: Negative for decreased libido, genital sores and incomplete emptying.  Neurological: Negative for brief paralysis, focal weakness, headaches and loss of balance.  Psychiatric/Behavioral: Negative for altered mental status, depression and suicidal ideas.  Allergic/Immunologic: Negative for HIV exposure and persistent infections.    EKGs/Labs/Other Studies Reviewed:    The following studies were reviewed today:   EKG: None today  Transthoracic echocardiogram November 30, 2020 IMPRESSIONS     1. Left ventricular ejection fraction, by estimation, is 55 to 60%. The  left ventricle  has normal function. The left ventricle has no regional  wall motion abnormalities. There is severe concentric left ventricular  hypertrophy. Left ventricular diastolic   parameters are consistent with Grade I diastolic dysfunction (impaired  relaxation).   2. Right ventricular systolic function is normal. The right ventricular  size is normal. There is normal pulmonary artery systolic pressure.   3. The mitral valve is normal in structure. No evidence of mitral valve  regurgitation. No evidence of mitral stenosis.   4. The aortic valve is tricuspid. Aortic valve regurgitation is not  visualized. No aortic stenosis is present.   5. The inferior vena cava is normal in size with greater than 50%  respiratory variability, suggesting right atrial pressure of 3 mmHg.   FINDINGS   Left Ventricle: Left ventricular ejection fraction, by estimation, is 55  to 60%. The left ventricle has normal function. The left ventricle has no  regional wall motion abnormalities. The left ventricular internal cavity  size was normal in size. There is   severe concentric left ventricular hypertrophy. Left ventricular  diastolic parameters are  consistent with Grade I diastolic dysfunction  (impaired relaxation). Normal left ventricular filling pressure.   Right Ventricle: The right ventricular size is normal. No increase in  right ventricular wall thickness. Right ventricular systolic function is  normal. There is normal pulmonary artery systolic pressure. The tricuspid  regurgitant velocity is 2.08 m/s, and   with an assumed right atrial pressure of 3 mmHg, the estimated right  ventricular systolic pressure is 22.2 mmHg.   Left Atrium: Left atrial size was normal in size.   Right Atrium: Right atrial size was normal in size.   Pericardium: There is no evidence of pericardial effusion.   Mitral Valve: The mitral valve is normal in structure. Mild mitral annular  calcification. No evidence of mitral valve regurgitation. No evidence of  mitral valve stenosis.   Tricuspid Valve: The tricuspid valve is normal in structure. Tricuspid  valve regurgitation is trivial. No evidence of tricuspid stenosis.   Aortic Valve: The aortic valve is tricuspid. Aortic valve regurgitation is  not visualized. No aortic stenosis is present.   Pulmonic Valve: The pulmonic valve was normal in structure. Pulmonic valve  regurgitation is not visualized. No evidence of pulmonic stenosis.   Aorta: The aortic root and ascending aorta are structurally normal, with  no evidence of dilitation and the aortic arch was not well visualized.   Venous: The pulmonary veins were not well visualized. The inferior vena  cava is normal in size with greater than 50% respiratory variability,  suggesting right atrial pressure of 3 mmHg.   IAS/Shunts: No atrial level shunt detected by color flow Doppler.   Recent Labs: 03/16/2021: Magnesium 1.9 05/19/2021: ALT 17; BUN 19; Creatinine, Ser 0.74; Hemoglobin 12.4; Platelets 252.0; Potassium 3.6; Sodium 143  Recent Lipid Panel No results found for: CHOL, TRIG, HDL, CHOLHDL, VLDL, LDLCALC, LDLDIRECT  Physical Exam:     VS:  BP (!) 144/76   Pulse 72   Ht 5' (1.524 m)   Wt 156 lb 9.6 oz (71 kg)   SpO2 98%   BMI 30.58 kg/m     Wt Readings from Last 3 Encounters:  06/07/21 156 lb 9.6 oz (71 kg)  05/19/21 159 lb 9.6 oz (72.4 kg)  04/27/21 161 lb 2 oz (73.1 kg)     GEN: Well nourished, well developed in no acute distress HEENT: Normal NECK: No JVD; No carotid bruits LYMPHATICS: No lymphadenopathy CARDIAC: S1S2  noted,RRR, no murmurs, rubs, gallops RESPIRATORY:  Clear to auscultation without rales, wheezing or rhonchi  ABDOMEN: Soft, non-tender, non-distended, +bowel sounds, no guarding. EXTREMITIES: No edema, No cyanosis, no clubbing MUSCULOSKELETAL:  No deformity  SKIN: Warm and dry NEUROLOGIC:  Alert and oriented x 3, non-focal PSYCHIATRIC:  Normal affect, good insight  ASSESSMENT:    1. Hyperlipidemia, unspecified hyperlipidemia type   2. Prediabetes   3. Hypertension, unspecified type   4. Obesity (BMI 30-39.9)    PLAN:    1.  Blood pressure has improved significantly we will continue her amlodipine 2.5 mg daily.  Reviewed her home blood pressure recording which appears to be at target.  2.  Prediabetes she is requesting hemoglobin A1c today we will get this done.  3.  No anginal symptoms we will continue her aspirin and statin.  4.  The patient understands the need to lose weight with diet and exercise. We have discussed specific strategies for this.  The patient is in agreement with the above plan. The patient left the office in stable condition.  The patient will follow up in   Medication Adjustments/Labs and Tests Ordered: Current medicines are reviewed at length with the patient today.  Concerns regarding medicines are outlined above.  Orders Placed This Encounter  Procedures   Hemoglobin A1c   Lipid panel   No orders of the defined types were placed in this encounter.   Patient Instructions  Medication Instructions:   Your physician recommends that you continue on  your current medications as directed. Please refer to the Current Medication list given to you today.  *If you need a refill on your cardiac medications before your next appointment, please call your pharmacy*   Lab Work: Your physician recommends that you return for lab work in:  4-6 weeks: HgbA1C, Lipids - come fasting  If you have labs (blood work) drawn today and your tests are completely normal, you will receive your results only by: [place] (if you have MyChart) OR A paper copy in the mail If you have any lab test that is abnormal or we need to change your treatment, we will call you to review the results.   Testing/Procedures: None   Follow-Up: At [facility], you and your health needs are our priority.  As part of our continuing mission to provide you with exceptional heart care, we have created designated Provider Care Teams.  These Care Teams include your primary Cardiologist (physician) and Advanced Practice Providers (APPs -  Physician Assistants and Nurse Practitioners) who all work together to provide you with the care you need, when you need it.  We recommend signing up for the patient portal called "MyChart".  Sign up information is provided on this After Visit Summary.  MyChart is used to connect with patients for Virtual Visits (Telemedicine).  Patients are able to view lab/test results, encounter notes, upcoming appointments, etc.  Non-urgent messages can be sent to your provider as well.   To learn more about what you can do with MyChart, go to [email or web address].    Your next appointment:   1 year(s)  The format for your next appointment:   In Person  Provider:   [address]  - Berniece Salines, DO   Other Instructions     Adopting a Healthy Lifestyle.  Know what a healthy weight is for you (roughly BMI <25) and aim to maintain this   Aim for 7+ servings of fruits and vegetables daily   65-80+ fluid ounces  of water or unsweet tea for  healthy kidneys   Limit to max 1 drink of alcohol per day; avoid smoking/tobacco   Limit animal fats in diet for cholesterol and heart health - choose grass fed whenever available   Avoid highly processed foods, and foods high in saturated/trans fats   Aim for low stress - take time to unwind and care for your mental health   Aim for 150 min of moderate intensity exercise weekly for heart health, and weights twice weekly for bone health   Aim for 7-9 hours of sleep daily   When it comes to diets, agreement about the perfect plan isnt easy to find, even among the experts. Experts at the [employer] developed an idea known as the Healthy Eating Plate. Just imagine a plate divided into logical, healthy portions.   The emphasis is on diet quality:   Load up on vegetables and fruits - one-half of your plate: Aim for color and variety, and remember that potatoes dont count.   Go for whole grains - one-quarter of your plate: Whole wheat, barley, wheat berries, quinoa, oats, brown rice, and foods made with them. If you want pasta, go with whole wheat pasta.   Protein power - one-quarter of your plate: Fish, chicken, beans, and nuts are all healthy, versatile protein sources. Limit red meat.   The diet, however, does go beyond the plate, offering a few other suggestions.   Use healthy plant oils, such as olive, canola, soy, corn, sunflower and peanut. Check the labels, and avoid partially hydrogenated oil, which have unhealthy trans fats.   If youre thirsty, drink water. Coffee and tea are good in moderation, but skip sugary drinks and limit milk and dairy products to one or two daily servings.   The type of carbohydrate in the diet is more important than the amount. Some sources of carbohydrates, such as vegetables, fruits, whole grains, and beans-are healthier than others.   Finally, stay active  Signed, Berniece Salines, DO  06/07/2021 10:47 AM    [place]

## 2021-07-08 DIAGNOSIS — R7303 Prediabetes: Secondary | ICD-10-CM | POA: Diagnosis not present

## 2021-07-08 DIAGNOSIS — E785 Hyperlipidemia, unspecified: Secondary | ICD-10-CM | POA: Diagnosis not present

## 2021-07-09 LAB — LIPID PANEL
Chol/HDL Ratio: 4.1 ratio (ref 0.0–4.4)
Cholesterol, Total: 252 mg/dL — ABNORMAL HIGH (ref 100–199)
HDL: 62 mg/dL (ref >39)
LDL Chol Calc (NIH): 170 mg/dL — ABNORMAL HIGH (ref 0–99)
Triglycerides: 115 mg/dL (ref 0–149)
VLDL Cholesterol Cal: 20 mg/dL (ref 5–40)

## 2021-07-09 LAB — HEMOGLOBIN A1C
Est. average glucose Bld gHb Est-mCnc: 123 mg/dL
Hgb A1c MFr Bld: 5.9 % — ABNORMAL HIGH (ref 4.8–5.6)

## 2021-07-12 ENCOUNTER — Other Ambulatory Visit: Payer: Self-pay

## 2021-07-12 ENCOUNTER — Telehealth: Payer: Self-pay

## 2021-07-12 MED ORDER — ROSUVASTATIN CALCIUM 10 MG PO TABS: 10.0000 mg | ORAL_TABLET | Freq: Every day | ORAL | 3 refills | Status: AC

## 2021-07-12 MED ORDER — ROSUVASTATIN CALCIUM 20 MG PO CPSP: 10.0000 mg | ORAL_CAPSULE | Freq: Every day | ORAL | 3 refills | Status: DC

## 2021-07-12 NOTE — Telephone Encounter (Signed)
See chart

## 2021-07-12 NOTE — Progress Notes (Signed)
Refill sent to pharmacy.   

## 2021-07-12 NOTE — Telephone Encounter (Signed)
Refill sent to pharmacy.   

## 2021-07-12 NOTE — Telephone Encounter (Signed)
Called patient to clarify medication dosages she is taking. Updated prescription was sent into pharmacy.

## 2021-07-28 DIAGNOSIS — L309 Dermatitis, unspecified: Secondary | ICD-10-CM | POA: Diagnosis not present

## 2021-09-26 DIAGNOSIS — Z6829 Body mass index (BMI) 29.0-29.9, adult: Secondary | ICD-10-CM | POA: Diagnosis not present

## 2021-09-26 DIAGNOSIS — M25511 Pain in right shoulder: Secondary | ICD-10-CM | POA: Diagnosis not present

## 2021-09-26 DIAGNOSIS — Z23 Encounter for immunization: Secondary | ICD-10-CM | POA: Diagnosis not present

## 2021-09-26 DIAGNOSIS — G8929 Other chronic pain: Secondary | ICD-10-CM | POA: Diagnosis not present

## 2021-10-11 DIAGNOSIS — J301 Allergic rhinitis due to pollen: Secondary | ICD-10-CM | POA: Diagnosis not present

## 2021-10-11 DIAGNOSIS — Z23 Encounter for immunization: Secondary | ICD-10-CM | POA: Diagnosis not present

## 2021-10-11 DIAGNOSIS — Z1331 Encounter for screening for depression: Secondary | ICD-10-CM | POA: Diagnosis not present

## 2021-10-11 DIAGNOSIS — Z131 Encounter for screening for diabetes mellitus: Secondary | ICD-10-CM | POA: Diagnosis not present

## 2021-10-11 DIAGNOSIS — Z79899 Other long term (current) drug therapy: Secondary | ICD-10-CM | POA: Diagnosis not present

## 2021-10-11 DIAGNOSIS — Z0000 Encounter for general adult medical examination without abnormal findings: Secondary | ICD-10-CM | POA: Diagnosis not present

## 2021-10-11 DIAGNOSIS — I10 Essential (primary) hypertension: Secondary | ICD-10-CM | POA: Diagnosis not present

## 2021-10-11 DIAGNOSIS — E782 Mixed hyperlipidemia: Secondary | ICD-10-CM | POA: Diagnosis not present

## 2021-10-26 DIAGNOSIS — Z6830 Body mass index (BMI) 30.0-30.9, adult: Secondary | ICD-10-CM | POA: Diagnosis not present

## 2021-10-26 DIAGNOSIS — E782 Mixed hyperlipidemia: Secondary | ICD-10-CM | POA: Diagnosis not present

## 2021-10-26 DIAGNOSIS — I10 Essential (primary) hypertension: Secondary | ICD-10-CM | POA: Diagnosis not present

## 2021-12-13 DIAGNOSIS — Z1231 Encounter for screening mammogram for malignant neoplasm of breast: Secondary | ICD-10-CM | POA: Diagnosis not present

## 2022-01-31 DIAGNOSIS — M7581 Other shoulder lesions, right shoulder: Secondary | ICD-10-CM | POA: Diagnosis not present

## 2022-01-31 DIAGNOSIS — M25511 Pain in right shoulder: Secondary | ICD-10-CM | POA: Diagnosis not present

## 2022-01-31 DIAGNOSIS — Z9889 Other specified postprocedural states: Secondary | ICD-10-CM | POA: Diagnosis not present

## 2022-01-31 DIAGNOSIS — G8929 Other chronic pain: Secondary | ICD-10-CM | POA: Diagnosis not present

## 2022-02-01 DIAGNOSIS — M25611 Stiffness of right shoulder, not elsewhere classified: Secondary | ICD-10-CM | POA: Diagnosis not present

## 2022-02-01 DIAGNOSIS — M25511 Pain in right shoulder: Secondary | ICD-10-CM | POA: Diagnosis not present

## 2022-02-01 DIAGNOSIS — R531 Weakness: Secondary | ICD-10-CM | POA: Diagnosis not present

## 2022-02-08 DIAGNOSIS — R531 Weakness: Secondary | ICD-10-CM | POA: Diagnosis not present

## 2022-02-08 DIAGNOSIS — M25611 Stiffness of right shoulder, not elsewhere classified: Secondary | ICD-10-CM | POA: Diagnosis not present

## 2022-02-08 DIAGNOSIS — M25511 Pain in right shoulder: Secondary | ICD-10-CM | POA: Diagnosis not present

## 2022-02-10 DIAGNOSIS — M25511 Pain in right shoulder: Secondary | ICD-10-CM | POA: Diagnosis not present

## 2022-02-10 DIAGNOSIS — R531 Weakness: Secondary | ICD-10-CM | POA: Diagnosis not present

## 2022-02-10 DIAGNOSIS — M25611 Stiffness of right shoulder, not elsewhere classified: Secondary | ICD-10-CM | POA: Diagnosis not present

## 2022-02-14 DIAGNOSIS — M25511 Pain in right shoulder: Secondary | ICD-10-CM | POA: Diagnosis not present

## 2022-02-14 DIAGNOSIS — M25611 Stiffness of right shoulder, not elsewhere classified: Secondary | ICD-10-CM | POA: Diagnosis not present

## 2022-02-14 DIAGNOSIS — R531 Weakness: Secondary | ICD-10-CM | POA: Diagnosis not present

## 2022-02-17 DIAGNOSIS — R531 Weakness: Secondary | ICD-10-CM | POA: Diagnosis not present

## 2022-02-17 DIAGNOSIS — M25511 Pain in right shoulder: Secondary | ICD-10-CM | POA: Diagnosis not present

## 2022-02-17 DIAGNOSIS — M25611 Stiffness of right shoulder, not elsewhere classified: Secondary | ICD-10-CM | POA: Diagnosis not present

## 2022-02-21 DIAGNOSIS — M25611 Stiffness of right shoulder, not elsewhere classified: Secondary | ICD-10-CM | POA: Diagnosis not present

## 2022-02-21 DIAGNOSIS — M25511 Pain in right shoulder: Secondary | ICD-10-CM | POA: Diagnosis not present

## 2022-02-21 DIAGNOSIS — R531 Weakness: Secondary | ICD-10-CM | POA: Diagnosis not present

## 2022-05-16 DIAGNOSIS — G8929 Other chronic pain: Secondary | ICD-10-CM | POA: Diagnosis not present

## 2022-05-16 DIAGNOSIS — Z79899 Other long term (current) drug therapy: Secondary | ICD-10-CM | POA: Diagnosis not present

## 2022-05-16 DIAGNOSIS — E782 Mixed hyperlipidemia: Secondary | ICD-10-CM | POA: Diagnosis not present

## 2022-05-16 DIAGNOSIS — Z6830 Body mass index (BMI) 30.0-30.9, adult: Secondary | ICD-10-CM | POA: Diagnosis not present

## 2022-05-16 DIAGNOSIS — Z0000 Encounter for general adult medical examination without abnormal findings: Secondary | ICD-10-CM | POA: Diagnosis not present

## 2022-05-16 DIAGNOSIS — J301 Allergic rhinitis due to pollen: Secondary | ICD-10-CM | POA: Diagnosis not present

## 2022-05-16 DIAGNOSIS — M25511 Pain in right shoulder: Secondary | ICD-10-CM | POA: Diagnosis not present

## 2022-05-16 DIAGNOSIS — Z131 Encounter for screening for diabetes mellitus: Secondary | ICD-10-CM | POA: Diagnosis not present

## 2022-05-16 DIAGNOSIS — I10 Essential (primary) hypertension: Secondary | ICD-10-CM | POA: Diagnosis not present

## 2022-05-25 ENCOUNTER — Telehealth: Payer: Self-pay | Admitting: Gastroenterology

## 2022-05-25 MED ORDER — DICYCLOMINE HCL 10 MG PO CAPS: 10.0000 mg | ORAL_CAPSULE | Freq: Two times a day (BID) | ORAL | 2 refills | Status: DC

## 2022-05-25 NOTE — Telephone Encounter (Signed)
Done

## 2022-05-25 NOTE — Telephone Encounter (Signed)
Patient needs a refill of dicyclomine sent to [facility].  Thank you.

## 2022-05-31 ENCOUNTER — Other Ambulatory Visit: Payer: Self-pay | Admitting: Gastroenterology

## 2022-06-20 DIAGNOSIS — M25552 Pain in left hip: Secondary | ICD-10-CM | POA: Diagnosis not present

## 2022-06-20 DIAGNOSIS — M7062 Trochanteric bursitis, left hip: Secondary | ICD-10-CM | POA: Diagnosis not present

## 2022-06-20 DIAGNOSIS — Z6831 Body mass index (BMI) 31.0-31.9, adult: Secondary | ICD-10-CM | POA: Diagnosis not present

## 2022-06-20 DIAGNOSIS — L80 Vitiligo: Secondary | ICD-10-CM | POA: Diagnosis not present

## 2022-08-15 DIAGNOSIS — E559 Vitamin D deficiency, unspecified: Secondary | ICD-10-CM | POA: Diagnosis not present

## 2022-08-15 DIAGNOSIS — R202 Paresthesia of skin: Secondary | ICD-10-CM | POA: Diagnosis not present

## 2022-08-15 DIAGNOSIS — Z6831 Body mass index (BMI) 31.0-31.9, adult: Secondary | ICD-10-CM | POA: Diagnosis not present

## 2022-08-15 DIAGNOSIS — N952 Postmenopausal atrophic vaginitis: Secondary | ICD-10-CM | POA: Diagnosis not present

## 2022-09-26 DIAGNOSIS — Z23 Encounter for immunization: Secondary | ICD-10-CM | POA: Diagnosis not present

## 2022-11-07 DIAGNOSIS — L815 Leukoderma, not elsewhere classified: Secondary | ICD-10-CM | POA: Diagnosis not present

## 2022-11-23 DIAGNOSIS — L719 Rosacea, unspecified: Secondary | ICD-10-CM | POA: Diagnosis not present

## 2022-11-23 DIAGNOSIS — Z6833 Body mass index (BMI) 33.0-33.9, adult: Secondary | ICD-10-CM | POA: Diagnosis not present

## 2022-11-23 DIAGNOSIS — E7439 Other disorders of intestinal carbohydrate absorption: Secondary | ICD-10-CM | POA: Diagnosis not present

## 2022-11-23 DIAGNOSIS — E782 Mixed hyperlipidemia: Secondary | ICD-10-CM | POA: Diagnosis not present

## 2022-11-23 DIAGNOSIS — I10 Essential (primary) hypertension: Secondary | ICD-10-CM | POA: Diagnosis not present

## 2022-12-20 ENCOUNTER — Ambulatory Visit: Payer: Medicare PPO | Attending: Cardiology | Admitting: Cardiology

## 2022-12-20 ENCOUNTER — Encounter: Payer: Self-pay | Admitting: Cardiology

## 2022-12-20 VITALS — BP 130/78 | HR 64 | Ht 61.0 in | Wt 176.8 lb

## 2022-12-20 DIAGNOSIS — I2510 Atherosclerotic heart disease of native coronary artery without angina pectoris: Secondary | ICD-10-CM

## 2022-12-20 DIAGNOSIS — R7303 Prediabetes: Secondary | ICD-10-CM | POA: Diagnosis not present

## 2022-12-20 DIAGNOSIS — E782 Mixed hyperlipidemia: Secondary | ICD-10-CM | POA: Diagnosis not present

## 2022-12-20 DIAGNOSIS — I10 Essential (primary) hypertension: Secondary | ICD-10-CM | POA: Diagnosis not present

## 2022-12-20 MED ORDER — ISOSORBIDE MONONITRATE ER 30 MG PO TB24: 30.0000 mg | ORAL_TABLET | Freq: Every day | ORAL | 3 refills | Status: DC

## 2022-12-20 NOTE — Patient Instructions (Signed)
Medication Instructions:  Your physician has recommended you make the following change in your medication:  START: Imdur '30mg'$  daily  *If you need a refill on your cardiac medications before your next appointment, please call your pharmacy*   Lab Work: NONE If you have labs (blood work) drawn today and your tests are completely normal, you will receive your results only by: [place] (if you have MyChart) OR A paper copy in the mail If you have any lab test that is abnormal or we need to change your treatment, we will call you to review the results.   Testing/Procedures: NONE   Follow-Up: At [facility], you and your health needs are our priority.  As part of our continuing mission to provide you with exceptional heart care, we have created designated Provider Care Teams.  These Care Teams include your primary Cardiologist (physician) and Advanced Practice Providers (APPs -  Physician Assistants and Nurse Practitioners) who all work together to provide you with the care you need, when you need it.  We recommend signing up for the patient portal called "MyChart".  Sign up information is provided on this After Visit Summary.  MyChart is used to connect with patients for Virtual Visits (Telemedicine).  Patients are able to view lab/test results, encounter notes, upcoming appointments, etc.  Non-urgent messages can be sent to your provider as well.   To learn more about what you can do with MyChart, go to [email or web address].    Your next appointment:   6 month(s)  The format for your next appointment:   In Person  Provider:   Berniece Salines, DO

## 2022-12-20 NOTE — Progress Notes (Signed)
Cardiology Office Note:    Date:  12/20/2022   ID:  Kelli Wagner, DOB [birth year], MRN [id number]  PCP:  Serita Grammes, MD  Cardiologist:  Berniece Salines, DO  Electrophysiologist:  None   Referring MD: Serita Grammes, MD    I am doing okay  History of Present Illness:    Kelli Wagner is a 77 y.o. female with a hx of coronary artery disease seen on coronary CTA, obesity, hyperlipidemia, prediabetes, hypertension is here today for follow-up visit.    I saw the patient December 2021 at that time she was experiencing chest pain we did a coronary CTA which showed coronary artery disease.  In addition an echocardiogram was done which showed diastolic dysfunction.   I saw the patient on March 16, 2021 at that time she was hypertensive but the patient did not want to start antihypertensive medication.  So I had her take her blood pressure daily and come for follow-up visit.  I saw the patient on Apr 22, 2021 at that time she was hypertensive we discussed starting medication she was started on amlodipine 2.5 mg daily.  She is here today for follow-up visit she did bring her blood pressure recordings with her which at home is averaging in the 280K systolic.  At her visit on June 2022 2 she was doing well from a cardiovascular standpoint.  She had no complaints.  She tells me that she has had an episode of chest discomfort several months ago since I saw her.  She is concerned about this.  He has not happened frequently but wanted me to know this. No shortness of breath.  Past Medical History:  Diagnosis Date   Allergy    Anemia    Anxiety    situational due to death of husband    Bursitis    Cataract    forming    History of diverticulitis    with hemorrhage (recurrent)    Hx of Clostridium difficile infection    Hypercholesteremia    Hypertension    elevated since death of husband 2019/05/15    Past Surgical History:  Procedure Laterality Date   ABDOMINAL HYSTERECTOMY      CHOLECYSTECTOMY     COLONOSCOPY  04/30/2014   Colonic polyps status post polypectomy. Otherwise normal postoperative colonoscopy.    KIDNEY SURGERY     Laproscopic sigmoid colon resection with primary anastomosis     POLYPECTOMY     TONSILLECTOMY      Current Medications: Current Meds  Medication Sig   amLODipine (NORVASC) 2.5 MG tablet Take 1 tablet (2.5 mg total) by mouth daily.   aspirin EC 81 MG tablet Take 1 tablet (81 mg total) by mouth daily. Swallow whole.   calcium-vitamin D (OSCAL WITH D) 500-200 MG-UNIT tablet Take 1 tablet by mouth daily with breakfast.   Cetirizine HCl (ALLERGY RELIEF CETIRIZINE PO) Take 1 tablet by mouth daily as needed (allergies).   cholecalciferol (VITAMIN D3) 25 MCG (1000 UT) tablet Take 2,000 Units by mouth daily.   dicyclomine (BENTYL) 10 MG capsule TAKE ONE CAPSULE BY MOUTH TWICE A DAY   ELDERBERRY PO Take 1 tablet by mouth 4 (four) times a week. Strength unknown   isosorbide mononitrate (IMDUR) 30 MG 24 hr tablet Take 1 tablet (30 mg total) by mouth daily.   meclizine (ANTIVERT) 25 MG tablet Take 25 mg by mouth 3 (three) times daily as needed for dizziness.   meloxicam (MOBIC) 15 MG tablet Take 15 mg by  mouth daily as needed for pain.   montelukast (SINGULAIR) 10 MG tablet Take 10 mg by mouth at bedtime.   Multiple Vitamins-Minerals (WOMENS 50+ MULTI VITAMIN/MIN PO) Take 1 capsule by mouth daily.   Naphazoline-Pheniramine (EYE ALLERGY RELIEF OP) Apply 2 drops to eye daily as needed (watery eyes).   Omega-3 Fatty Acids (FISH OIL PO) Take 1,576 mg by mouth 3 (three) times a week.   OVER THE COUNTER MEDICATION 2 Tumeric with pepper daily at night, 1 in the morning   rosuvastatin (CRESTOR) 10 MG tablet Take 1 tablet (10 mg total) by mouth daily.   vitamin C (ASCORBIC ACID) 500 MG tablet Take 500 mg by mouth daily.     Allergies:   Patient has no known allergies.   Social History   Socioeconomic History   Marital status: Widowed    Spouse name:  Not on file   Number of children: Not on file   Years of education: Not on file   Highest education level: Not on file  Occupational History   Not on file  Tobacco Use   Smoking status: Former   Smokeless tobacco: Never  Vaping Use   Vaping Use: Never used  Substance and Sexual Activity   Alcohol use: Not Currently    Comment: quit 1968   Drug use: Never   Sexual activity: Not on file  Other Topics Concern   Not on file  Social History Narrative   Not on file   Social Determinants of Health   Financial Resource Strain: Not on file  Food Insecurity: Not on file  Transportation Needs: Not on file  Physical Activity: Not on file  Stress: Not on file  Social Connections: Not on file     Family History: The patient's family history includes CAD in her mother; Heart failure in her father. There is no history of Colon cancer, Colon polyps, Esophageal cancer, Rectal cancer, or Stomach cancer.  ROS:   Review of Systems  Constitution: Negative for decreased appetite, fever and weight gain.  HENT: Negative for congestion, ear discharge, hoarse voice and sore throat.   Eyes: Negative for discharge, redness, vision loss in right eye and visual halos.  Cardiovascular: Reports chest pain.  Negative for, dyspnea on exertion, leg swelling, orthopnea and palpitations.  Respiratory: Negative for cough, hemoptysis, shortness of breath and snoring.   Endocrine: Negative for heat intolerance and polyphagia.  Hematologic/Lymphatic: Negative for bleeding problem. Does not bruise/bleed easily.  Skin: Negative for flushing, nail changes, rash and suspicious lesions.  Musculoskeletal: Negative for arthritis, joint pain, muscle cramps, myalgias, neck pain and stiffness.  Gastrointestinal: Negative for abdominal pain, bowel incontinence, diarrhea and excessive appetite.  Genitourinary: Negative for decreased libido, genital sores and incomplete emptying.  Neurological: Negative for brief paralysis,  focal weakness, headaches and loss of balance.  Psychiatric/Behavioral: Negative for altered mental status, depression and suicidal ideas.  Allergic/Immunologic: Negative for HIV exposure and persistent infections.    EKGs/Labs/Other Studies Reviewed:    The following studies were reviewed today:   EKG: None today  Transthoracic echocardiogram November 30, 2020 IMPRESSIONS     1. Left ventricular ejection fraction, by estimation, is 55 to 60%. The  left ventricle has normal function. The left ventricle has no regional  wall motion abnormalities. There is severe concentric left ventricular  hypertrophy. Left ventricular diastolic   parameters are consistent with Grade I diastolic dysfunction (impaired  relaxation).   2. Right ventricular systolic function is normal. The right ventricular  size is normal. There is normal pulmonary artery systolic pressure.   3. The mitral valve is normal in structure. No evidence of mitral valve  regurgitation. No evidence of mitral stenosis.   4. The aortic valve is tricuspid. Aortic valve regurgitation is not  visualized. No aortic stenosis is present.   5. The inferior vena cava is normal in size with greater than 50%  respiratory variability, suggesting right atrial pressure of 3 mmHg.   FINDINGS   Left Ventricle: Left ventricular ejection fraction, by estimation, is 55  to 60%. The left ventricle has normal function. The left ventricle has no  regional wall motion abnormalities. The left ventricular internal cavity  size was normal in size. There is   severe concentric left ventricular hypertrophy. Left ventricular  diastolic parameters are consistent with Grade I diastolic dysfunction  (impaired relaxation). Normal left ventricular filling pressure.   Right Ventricle: The right ventricular size is normal. No increase in  right ventricular wall thickness. Right ventricular systolic function is  normal. There is normal pulmonary artery  systolic pressure. The tricuspid  regurgitant velocity is 2.08 m/s, and   with an assumed right atrial pressure of 3 mmHg, the estimated right  ventricular systolic pressure is 40.9 mmHg.   Left Atrium: Left atrial size was normal in size.   Right Atrium: Right atrial size was normal in size.   Pericardium: There is no evidence of pericardial effusion.   Mitral Valve: The mitral valve is normal in structure. Mild mitral annular  calcification. No evidence of mitral valve regurgitation. No evidence of  mitral valve stenosis.   Tricuspid Valve: The tricuspid valve is normal in structure. Tricuspid  valve regurgitation is trivial. No evidence of tricuspid stenosis.   Aortic Valve: The aortic valve is tricuspid. Aortic valve regurgitation is  not visualized. No aortic stenosis is present.   Pulmonic Valve: The pulmonic valve was normal in structure. Pulmonic valve  regurgitation is not visualized. No evidence of pulmonic stenosis.   Aorta: The aortic root and ascending aorta are structurally normal, with  no evidence of dilitation and the aortic arch was not well visualized.   Venous: The pulmonary veins were not well visualized. The inferior vena  cava is normal in size with greater than 50% respiratory variability,  suggesting right atrial pressure of 3 mmHg.   IAS/Shunts: No atrial level shunt detected by color flow Doppler.   Recent Labs: No results found for requested labs within last 365 days.  Recent Lipid Panel    Component Value Date/Time   CHOL 252 (H) 07/08/2021 0834   TRIG 115 07/08/2021 0834   HDL 62 07/08/2021 0834   CHOLHDL 4.1 07/08/2021 0834   LDLCALC 170 (H) 07/08/2021 0834    Physical Exam:    VS:  BP 130/78   Pulse 64   Ht '5\' 1"'$  (1.549 m)   Wt 80.2 kg   SpO2 98%   BMI 33.41 kg/m     Wt Readings from Last 3 Encounters:  12/20/22 80.2 kg  06/07/21 71 kg  05/19/21 72.4 kg     GEN: Well nourished, well developed in no acute distress HEENT:  Normal NECK: No JVD; No carotid bruits LYMPHATICS: No lymphadenopathy CARDIAC: S1S2 noted,RRR, no murmurs, rubs, gallops RESPIRATORY:  Clear to auscultation without rales, wheezing or rhonchi  ABDOMEN: Soft, non-tender, non-distended, +bowel sounds, no guarding. EXTREMITIES: No edema, No cyanosis, no clubbing MUSCULOSKELETAL:  No deformity  SKIN: Warm and dry NEUROLOGIC:  Alert and oriented x  3, non-focal PSYCHIATRIC:  Normal affect, good insight  ASSESSMENT:    1. Coronary artery disease involving native coronary artery of native heart, unspecified whether angina present   2. Prediabetes   3. Hypertension, unspecified type   4. Mixed hyperlipidemia     PLAN:    1.  Blood pressure has improved significantly we will continue her amlodipine 2.5 mg daily.  Reviewed her home blood pressure recording which appears to be at target.  2.  Prediabetes she is requesting hemoglobin A1c today we will get this done.  3.  Coronary artery disease-she has had an episode of suspected to be age-related.  Will start her on low-dose Imdur 30 mg daily.  Continue her aspirin and statin.  4.  The patient understands the need to lose weight with diet and exercise. We have discussed specific strategies for this.  The patient is in agreement with the above plan. The patient left the office in stable condition.  The patient will follow up in 6 months   Medication Adjustments/Labs and Tests Ordered: Current medicines are reviewed at length with the patient today.  Concerns regarding medicines are outlined above.  Orders Placed This Encounter  Procedures   EKG 12-Lead   Meds ordered this encounter  Medications   isosorbide mononitrate (IMDUR) 30 MG 24 hr tablet    Sig: Take 1 tablet (30 mg total) by mouth daily.    Dispense:  90 tablet    Refill:  3     Patient Instructions  Medication Instructions:  Your physician has recommended you make the following change in your medication:  START: Imdur  '30mg'$  daily  *If you need a refill on your cardiac medications before your next appointment, please call your pharmacy*   Lab Work: NONE If you have labs (blood work) drawn today and your tests are completely normal, you will receive your results only by: [place] (if you have MyChart) OR A paper copy in the mail If you have any lab test that is abnormal or we need to change your treatment, we will call you to review the results.   Testing/Procedures: NONE   Follow-Up: At [facility], you and your health needs are our priority.  As part of our continuing mission to provide you with exceptional heart care, we have created designated Provider Care Teams.  These Care Teams include your primary Cardiologist (physician) and Advanced Practice Providers (APPs -  Physician Assistants and Nurse Practitioners) who all work together to provide you with the care you need, when you need it.  We recommend signing up for the patient portal called "MyChart".  Sign up information is provided on this After Visit Summary.  MyChart is used to connect with patients for Virtual Visits (Telemedicine).  Patients are able to view lab/test results, encounter notes, upcoming appointments, etc.  Non-urgent messages can be sent to your provider as well.   To learn more about what you can do with MyChart, go to [email or web address].    Your next appointment:   6 month(s)  The format for your next appointment:   In Person  Provider:   Berniece Salines, DO     Adopting a Healthy Lifestyle.  Know what a healthy weight is for you (roughly BMI <25) and aim to maintain this   Aim for 7+ servings of fruits and vegetables daily   65-80+ fluid ounces of water or unsweet tea for healthy kidneys   Limit to max 1 drink of alcohol per day;  avoid smoking/tobacco   Limit animal fats in diet for cholesterol and heart health - choose grass fed whenever available   Avoid highly processed foods, and foods  high in saturated/trans fats   Aim for low stress - take time to unwind and care for your mental health   Aim for 150 min of moderate intensity exercise weekly for heart health, and weights twice weekly for bone health   Aim for 7-9 hours of sleep daily   When it comes to diets, agreement about the perfect plan isnt easy to find, even among the experts. Experts at the [employer] developed an idea known as the Healthy Eating Plate. Just imagine a plate divided into logical, healthy portions.   The emphasis is on diet quality:   Load up on vegetables and fruits - one-half of your plate: Aim for color and variety, and remember that potatoes dont count.   Go for whole grains - one-quarter of your plate: Whole wheat, barley, wheat berries, quinoa, oats, brown rice, and foods made with them. If you want pasta, go with whole wheat pasta.   Protein power - one-quarter of your plate: Fish, chicken, beans, and nuts are all healthy, versatile protein sources. Limit red meat.   The diet, however, does go beyond the plate, offering a few other suggestions.   Use healthy plant oils, such as olive, canola, soy, corn, sunflower and peanut. Check the labels, and avoid partially hydrogenated oil, which have unhealthy trans fats.   If youre thirsty, drink water. Coffee and tea are good in moderation, but skip sugary drinks and limit milk and dairy products to one or two daily servings.   The type of carbohydrate in the diet is more important than the amount. Some sources of carbohydrates, such as vegetables, fruits, whole grains, and beans-are healthier than others.   Finally, stay active  Signed, Berniece Salines, DO  12/20/2022 9:16 PM    [facility]

## 2023-01-01 DIAGNOSIS — Z1231 Encounter for screening mammogram for malignant neoplasm of breast: Secondary | ICD-10-CM | POA: Diagnosis not present

## 2023-05-22 DIAGNOSIS — G8929 Other chronic pain: Secondary | ICD-10-CM | POA: Diagnosis not present

## 2023-05-22 DIAGNOSIS — Z79899 Other long term (current) drug therapy: Secondary | ICD-10-CM | POA: Diagnosis not present

## 2023-05-22 DIAGNOSIS — D509 Iron deficiency anemia, unspecified: Secondary | ICD-10-CM | POA: Diagnosis not present

## 2023-05-22 DIAGNOSIS — R7303 Prediabetes: Secondary | ICD-10-CM | POA: Diagnosis not present

## 2023-05-22 DIAGNOSIS — Z0000 Encounter for general adult medical examination without abnormal findings: Secondary | ICD-10-CM | POA: Diagnosis not present

## 2023-05-22 DIAGNOSIS — J301 Allergic rhinitis due to pollen: Secondary | ICD-10-CM | POA: Diagnosis not present

## 2023-05-22 DIAGNOSIS — Z6833 Body mass index (BMI) 33.0-33.9, adult: Secondary | ICD-10-CM | POA: Diagnosis not present

## 2023-05-22 DIAGNOSIS — E782 Mixed hyperlipidemia: Secondary | ICD-10-CM | POA: Diagnosis not present

## 2023-05-22 DIAGNOSIS — I10 Essential (primary) hypertension: Secondary | ICD-10-CM | POA: Diagnosis not present

## 2023-05-22 DIAGNOSIS — Z1331 Encounter for screening for depression: Secondary | ICD-10-CM | POA: Diagnosis not present

## 2023-05-22 DIAGNOSIS — M25511 Pain in right shoulder: Secondary | ICD-10-CM | POA: Diagnosis not present

## 2023-06-13 ENCOUNTER — Other Ambulatory Visit: Payer: Self-pay | Admitting: Gastroenterology

## 2023-07-20 ENCOUNTER — Ambulatory Visit: Payer: Medicare PPO | Admitting: Cardiology

## 2023-09-13 ENCOUNTER — Ambulatory Visit: Payer: Medicare PPO | Attending: Cardiology | Admitting: Cardiology

## 2023-09-13 DIAGNOSIS — E1100 Type 2 diabetes mellitus with hyperosmolarity without nonketotic hyperglycemic-hyperosmolar coma (NKHHC): Secondary | ICD-10-CM | POA: Diagnosis not present

## 2023-09-13 DIAGNOSIS — I2510 Atherosclerotic heart disease of native coronary artery without angina pectoris: Secondary | ICD-10-CM

## 2023-09-13 DIAGNOSIS — E782 Mixed hyperlipidemia: Secondary | ICD-10-CM | POA: Diagnosis not present

## 2023-09-13 NOTE — Patient Instructions (Signed)
Medication Instructions:  Your physician recommends that you continue on your current medications as directed. Please refer to the Current Medication list given to you today.  *If you need a refill on your cardiac medications before your next appointment, please call your pharmacy*   Lab Work: None   Testing/Procedures: None   Follow-Up: At [facility], you and your health needs are our priority.  As part of our continuing mission to provide you with exceptional heart care, we have created designated Provider Care Teams.  These Care Teams include your primary Cardiologist (physician) and Advanced Practice Providers (APPs -  Physician Assistants and Nurse Practitioners) who all work together to provide you with the care you need, when you need it.   Your next appointment:   1 year(s)  Provider:   Kardie Tobb, DO   

## 2023-09-13 NOTE — Progress Notes (Signed)
Cardiology Office Note:    Date:  09/13/2023   ID:  Kelli Wagner, DOB August 09, 1946, MRN [id number]  PCP:  Buckner Malta, MD  Cardiologist:  Thomasene Ripple, DO  Electrophysiologist:  None   Referring MD: Buckner Malta, MD   " I am doing well"  History of Present Illness:    Kelli Wagner is a 77 y.o. female with a hx of history of coronary artery disease seen on coronary CTA, obesity, hyperlipidemia, diabetes mellitus here today for follow-up visit.  She reports no changes in her health status since her last visit in January 2024. She has been adhering to her prescribed medication regimen and has not experienced any new or worsening symptoms. She maintains an active lifestyle within her community and church.   Past Medical History:  Diagnosis Date   Allergy    Anemia    Anxiety    situational due to death of husband    Bursitis    Cataract    forming    History of diverticulitis    with hemorrhage (recurrent)    Hx of Clostridium difficile infection    Hypercholesteremia    Hypertension    elevated since death of husband May 11, 2019    Past Surgical History:  Procedure Laterality Date   ABDOMINAL HYSTERECTOMY     CHOLECYSTECTOMY     COLONOSCOPY  04/30/2014   Colonic polyps status post polypectomy. Otherwise normal postoperative colonoscopy.    KIDNEY SURGERY     Laproscopic sigmoid colon resection with primary anastomosis     POLYPECTOMY     TONSILLECTOMY      Current Medications: Current Meds  Medication Sig   aspirin EC 81 MG tablet Take 1 tablet (81 mg total) by mouth daily. Swallow whole.   Cetirizine HCl (ALLERGY RELIEF CETIRIZINE PO) Take 1 tablet by mouth daily as needed (allergies).   cholecalciferol (VITAMIN D3) 25 MCG (1000 UT) tablet Take 2,000 Units by mouth daily.   dicyclomine (BENTYL) 10 MG capsule TAKE ONE CAPSULE BY MOUTH TWICE A DAY   ELDERBERRY PO Take 1 tablet by mouth 4 (four) times a week. Strength unknown   isosorbide mononitrate  (IMDUR) 30 MG 24 hr tablet Take 1 tablet (30 mg total) by mouth daily.   meclizine (ANTIVERT) 25 MG tablet Take 25 mg by mouth 3 (three) times daily as needed for dizziness.   meloxicam (MOBIC) 15 MG tablet Take 15 mg by mouth daily as needed for pain.   montelukast (SINGULAIR) 10 MG tablet Take 10 mg by mouth at bedtime.   Multiple Vitamins-Minerals (WOMENS 50+ MULTI VITAMIN/MIN PO) Take 1 capsule by mouth daily.   Omega-3 Fatty Acids (FISH OIL PO) Take 1,576 mg by mouth 3 (three) times a week.   OVER THE COUNTER MEDICATION 2 Tumeric with pepper daily at night, 1 in the morning   vitamin C (ASCORBIC ACID) 500 MG tablet Take 500 mg by mouth daily.     Allergies:   Patient has no known allergies.   Social History   Socioeconomic History   Marital status: Widowed    Spouse name: Not on file   Number of children: Not on file   Years of education: Not on file   Highest education level: Not on file  Occupational History   Not on file  Tobacco Use   Smoking status: Former   Smokeless tobacco: Never  Vaping Use   Vaping status: Never Used  Substance and Sexual Activity   Alcohol use: Not Currently  Comment: quit 1968   Drug use: Never   Sexual activity: Not on file  Other Topics Concern   Not on file  Social History Narrative   Not on file   Social Determinants of Health   Financial Resource Strain: Not on file  Food Insecurity: Not on file  Transportation Needs: Not on file  Physical Activity: Not on file  Stress: Not on file  Social Connections: Not on file     Family History: The patient's family history includes CAD in her mother; Heart failure in her father. There is no history of Colon cancer, Colon polyps, Esophageal cancer, Rectal cancer, or Stomach cancer.  ROS:   Review of Systems  Constitution: Negative for decreased appetite, fever and weight gain.  HENT: Negative for congestion, ear discharge, hoarse voice and sore throat.   Eyes: Negative for discharge,  redness, vision loss in right eye and visual halos.  Cardiovascular: Negative for chest pain, dyspnea on exertion, leg swelling, orthopnea and palpitations.  Respiratory: Negative for cough, hemoptysis, shortness of breath and snoring.   Endocrine: Negative for heat intolerance and polyphagia.  Hematologic/Lymphatic: Negative for bleeding problem. Does not bruise/bleed easily.  Skin: Negative for flushing, nail changes, rash and suspicious lesions.  Musculoskeletal: Negative for arthritis, joint pain, muscle cramps, myalgias, neck pain and stiffness.  Gastrointestinal: Negative for abdominal pain, bowel incontinence, diarrhea and excessive appetite.  Genitourinary: Negative for decreased libido, genital sores and incomplete emptying.  Neurological: Negative for brief paralysis, focal weakness, headaches and loss of balance.  Psychiatric/Behavioral: Negative for altered mental status, depression and suicidal ideas.  Allergic/Immunologic: Negative for HIV exposure and persistent infections.    EKGs/Labs/Other Studies Reviewed:    The following studies were reviewed today:   EKG:  The ekg ordered today demonstrates   Recent Labs: No results found for requested labs within last 365 days.  Recent Lipid Panel    Component Value Date/Time   CHOL 252 (H) 07/08/2021 0834   TRIG 115 07/08/2021 0834   HDL 62 07/08/2021 0834   CHOLHDL 4.1 07/08/2021 0834   LDLCALC 170 (H) 07/08/2021 0834    Physical Exam:    VS:  There were no vitals taken for this visit.    Wt Readings from Last 3 Encounters:  12/20/22 176 lb 12.8 oz (80.2 kg)  06/07/21 156 lb 9.6 oz (71 kg)  05/19/21 159 lb 9.6 oz (72.4 kg)     GEN: Well nourished, well developed in no acute distress HEENT: Normal NECK: No JVD; No carotid bruits LYMPHATICS: No lymphadenopathy CARDIAC: S1S2 noted,RRR, no murmurs, rubs, gallops RESPIRATORY:  Clear to auscultation without rales, wheezing or rhonchi  ABDOMEN: Soft, non-tender,  non-distended, +bowel sounds, no guarding. EXTREMITIES: No edema, No cyanosis, no clubbing MUSCULOSKELETAL:  No deformity  SKIN: Warm and dry NEUROLOGIC:  Alert and oriented x 3, non-focal PSYCHIATRIC:  Normal affect, good insight  ASSESSMENT:    1. Coronary artery disease involving native coronary artery of native heart without angina pectoris   2. Type 2 diabetes mellitus with hyperosmolarity without coma, without long-term current use of insulin (HCC)   3. Mixed hyperlipidemia    PLAN:      Stable, no changes in symptoms or medications since last visit in January 2024. -Continue current management. -Reviewed labs from summer 2024, no abnormalities noted. -Next follow-up visit in one year, unless issues arise.  The patient is in agreement with the above plan. The patient left the office in stable condition.  The patient will  follow up in   Medication Adjustments/Labs and Tests Ordered: Current medicines are reviewed at length with the patient today.  Concerns regarding medicines are outlined above.  No orders of the defined types were placed in this encounter.  No orders of the defined types were placed in this encounter.   Patient Instructions  Medication Instructions:  Your physician recommends that you continue on your current medications as directed. Please refer to the Current Medication list given to you today.  *If you need a refill on your cardiac medications before your next appointment, please call your pharmacy*   Lab Work: None   Testing/Procedures: None   Follow-Up: At [facility], you and your health needs are our priority.  As part of our continuing mission to provide you with exceptional heart care, we have created designated Provider Care Teams.  These Care Teams include your primary Cardiologist (physician) and Advanced Practice Providers (APPs -  Physician Assistants and Nurse Practitioners) who all work together to provide you with the  care you need, when you need it.  Your next appointment:   1 year(s)  Provider:   Thomasene Ripple, DO     Adopting a Healthy Lifestyle.  Know what a healthy weight is for you (roughly BMI <25) and aim to maintain this   Aim for 7+ servings of fruits and vegetables daily   65-80+ fluid ounces of water or unsweet tea for healthy kidneys   Limit to max 1 drink of alcohol per day; avoid smoking/tobacco   Limit animal fats in diet for cholesterol and heart health - choose grass fed whenever available   Avoid highly processed foods, and foods high in saturated/trans fats   Aim for low stress - take time to unwind and care for your mental health   Aim for 150 min of moderate intensity exercise weekly for heart health, and weights twice weekly for bone health   Aim for 7-9 hours of sleep daily   When it comes to diets, agreement about the perfect plan isnt easy to find, even among the experts. Experts at the [facility] [employer] developed an idea known as the Healthy Eating Plate. Just imagine a plate divided into logical, healthy portions.   The emphasis is on diet quality:   Load up on vegetables and fruits - one-half of your plate: Aim for color and variety, and remember that potatoes dont count.   Go for whole grains - one-quarter of your plate: Whole wheat, barley, wheat berries, quinoa, oats, brown rice, and foods made with them. If you want pasta, go with whole wheat pasta.   Protein power - one-quarter of your plate: Fish, chicken, beans, and nuts are all healthy, versatile protein sources. Limit red meat.   The diet, however, does go beyond the plate, offering a few other suggestions.   Use healthy plant oils, such as olive, canola, soy, corn, sunflower and peanut. Check the labels, and avoid partially hydrogenated oil, which have unhealthy trans fats.   If youre thirsty, drink water. Coffee and tea are good in moderation, but skip sugary drinks and limit milk  and dairy products to one or two daily servings.   The type of carbohydrate in the diet is more important than the amount. Some sources of carbohydrates, such as vegetables, fruits, whole grains, and beans-are healthier than others.   Finally, stay active  Signed, Thomasene Ripple, DO  09/13/2023 11:34 AM    [facility]

## 2023-09-24 DIAGNOSIS — Z23 Encounter for immunization: Secondary | ICD-10-CM | POA: Diagnosis not present

## 2023-11-13 DIAGNOSIS — J301 Allergic rhinitis due to pollen: Secondary | ICD-10-CM | POA: Diagnosis not present

## 2023-11-13 DIAGNOSIS — I10 Essential (primary) hypertension: Secondary | ICD-10-CM | POA: Diagnosis not present

## 2023-11-13 DIAGNOSIS — H6991 Unspecified Eustachian tube disorder, right ear: Secondary | ICD-10-CM | POA: Diagnosis not present

## 2023-11-13 DIAGNOSIS — Z6832 Body mass index (BMI) 32.0-32.9, adult: Secondary | ICD-10-CM | POA: Diagnosis not present

## 2023-11-21 DIAGNOSIS — M1712 Unilateral primary osteoarthritis, left knee: Secondary | ICD-10-CM | POA: Diagnosis not present

## 2023-11-21 DIAGNOSIS — E782 Mixed hyperlipidemia: Secondary | ICD-10-CM | POA: Diagnosis not present

## 2023-11-21 DIAGNOSIS — R7303 Prediabetes: Secondary | ICD-10-CM | POA: Diagnosis not present

## 2023-11-21 DIAGNOSIS — I10 Essential (primary) hypertension: Secondary | ICD-10-CM | POA: Diagnosis not present

## 2023-11-21 DIAGNOSIS — M25541 Pain in joints of right hand: Secondary | ICD-10-CM | POA: Diagnosis not present

## 2023-11-21 DIAGNOSIS — Z6833 Body mass index (BMI) 33.0-33.9, adult: Secondary | ICD-10-CM | POA: Diagnosis not present

## 2023-11-26 ENCOUNTER — Telehealth: Payer: Self-pay | Admitting: Physician Assistant

## 2023-11-26 MED ORDER — DICYCLOMINE HCL 10 MG PO CAPS: 10.0000 mg | ORAL_CAPSULE | Freq: Two times a day (BID) | ORAL | 0 refills | Status: DC

## 2023-11-26 NOTE — Telephone Encounter (Signed)
Rx for Bentyl sent to pharmacy as requested.

## 2023-11-26 NOTE — Telephone Encounter (Signed)
Inbound call from patient, requesting refill for dicyclomine sent to [facility].

## 2024-01-25 DIAGNOSIS — Z1231 Encounter for screening mammogram for malignant neoplasm of breast: Secondary | ICD-10-CM | POA: Diagnosis not present

## 2024-01-29 DIAGNOSIS — Z1231 Encounter for screening mammogram for malignant neoplasm of breast: Secondary | ICD-10-CM | POA: Diagnosis not present

## 2024-02-12 ENCOUNTER — Telehealth: Payer: Self-pay | Admitting: Cardiology

## 2024-02-12 MED ORDER — ISOSORBIDE MONONITRATE ER 30 MG PO TB24: 30.0000 mg | ORAL_TABLET | Freq: Every day | ORAL | 2 refills | Status: DC

## 2024-02-12 NOTE — Telephone Encounter (Signed)
*  STAT* If patient is at the pharmacy, call can be transferred to refill team.   1. Which medications need to be refilled? (please list name of each medication and dose if known)   isosorbide mononitrate (IMDUR) 30 MG 24 hr tablet      4. Which pharmacy/location (including street and city if local pharmacy) is medication to be sent to? [facility] - [place], [place] - [address]     5. Do they need a 30 day or 90 day supply? 90

## 2024-02-13 ENCOUNTER — Ambulatory Visit: Payer: Medicare PPO | Admitting: Gastroenterology

## 2024-03-06 DIAGNOSIS — I10 Essential (primary) hypertension: Secondary | ICD-10-CM | POA: Diagnosis not present

## 2024-03-06 DIAGNOSIS — G5702 Lesion of sciatic nerve, left lower limb: Secondary | ICD-10-CM | POA: Diagnosis not present

## 2024-03-06 DIAGNOSIS — Z6832 Body mass index (BMI) 32.0-32.9, adult: Secondary | ICD-10-CM | POA: Diagnosis not present

## 2024-03-17 ENCOUNTER — Encounter: Payer: Self-pay | Admitting: Physician Assistant

## 2024-03-17 ENCOUNTER — Ambulatory Visit (INDEPENDENT_AMBULATORY_CARE_PROVIDER_SITE_OTHER): Admitting: Physician Assistant

## 2024-03-17 VITALS — BP 158/80 | HR 68 | Ht 60.0 in | Wt 176.4 lb

## 2024-03-17 DIAGNOSIS — Z6834 Body mass index (BMI) 34.0-34.9, adult: Secondary | ICD-10-CM

## 2024-03-17 DIAGNOSIS — Z8719 Personal history of other diseases of the digestive system: Secondary | ICD-10-CM | POA: Diagnosis not present

## 2024-03-17 DIAGNOSIS — Z860101 Personal history of adenomatous and serrated colon polyps: Secondary | ICD-10-CM | POA: Diagnosis not present

## 2024-03-17 DIAGNOSIS — Z8619 Personal history of other infectious and parasitic diseases: Secondary | ICD-10-CM

## 2024-03-17 DIAGNOSIS — E669 Obesity, unspecified: Secondary | ICD-10-CM

## 2024-03-17 MED ORDER — DICYCLOMINE HCL 10 MG PO CAPS: 10.0000 mg | ORAL_CAPSULE | Freq: Two times a day (BID) | ORAL | 0 refills | Status: DC

## 2024-03-17 NOTE — Progress Notes (Signed)
 03/17/2024 Kelli Wagner [id number] 1946/07/17  Referring provider: Buckner Malta, MD Primary GI doctor: Dr. Chales Abrahams  ASSESSMENT AND PLAN:   Personal history of tubular adenomatous polyp June 2020 colonoscopy small TA healthy anastomosis, single diverticulum sigmoid Recall 05/2024 Her health is good but she is uncertain she would like to proceed or not, discussed risks and benefits.  Best friend is in hospice and her husband has passed, she does not have ride, we will set up reminder to call her in August/Sept to see if she is ready for colonoscopy at that sometime.   History of C. difficile colitis No diarrhea, no symptoms Discussed risk with ABX  History of complicated diverticulitis Status post sigmoid resection in 2012 No symptoms, has been on dicyclomine twice a day for years, uncertain why she is taking it, will cut back to once a day, then stop and see if she needs it any more Given FODMAP diet and IBGard  Morbid obesity  Body mass index is 34.45 kg/m.  -Patient has been advised to make an attempt to improve diet and exercise patterns to aid in weight loss. -Recommended diet heavy in fruits and veggies and low in animal meats, cheeses, and dairy products, appropriate calorie intake  CAD nonobstructive/diastolic dysfunction Follows with Dr. Servando Salina, no symptoms Echo 2021 grade 1 diastolic dysfunction EF 55 to 96% normal valves   Patient Care Team: Buckner Malta, MD as PCP - General (Family Medicine) Thomasene Ripple, DO as PCP - Cardiology (Cardiology)  HISTORY OF PRESENT ILLNESS: 78 y.o. female with a past medical history listed below presents for evaluation of medication refill.   Patient last seen in the office 2022 by Mike Gip PA for abdominal pain and nausea had CT abdomen pelvis at that time showed diverticulosis no diverticulitis status postcholecystectomy with stable biliary ductal dilation overall negative  Discussed the use of AI scribe software  for clinical note transcription with the patient, who gave verbal consent to proceed.  History of Present Illness   Kelli Wagner is a 77 year old female who presents for a medication update.  She is currently taking dicyclomine twice daily for abdominal pain, which she has been on for a long time. She has regular bowel movements and no abdominal pain, gas, bloating, dark stools, blood in the stool, nausea, or vomiting. She is considering reducing her dicyclomine to once daily to see if she can manage without it.  She has a history of diverticulitis, for which she underwent a resection in 2012. Her last colonoscopy was in 2020, during which a small tubular adenomatous polyp was removed. Previous polyps were removed in 2015, all benign. She is scheduled for her next colonoscopy in June 2025. There is no family history of colon cancer, and she is currently asymptomatic.  She has a past medical history of C. difficile infection, which occurred once a long time ago, and she has not experienced diarrhea since. She follows up with a cardiologist for her heart but reports no symptoms such as chest pain or shortness of breath.  Socially, her husband passed away, and she does not have a driver for her upcoming colonoscopy. She has one son who lives in [place] and several grandchildren and great-grandchildren who live out of town. She is currently dealing with the emotional impact of her best friend being in hospice and another friend recently losing her husband.     She  reports that she has quit smoking. She has never used smokeless  tobacco. She reports that she does not currently use alcohol. She reports that she does not use drugs.  RELEVANT GI HISTORY, IMAGING AND LABS: Results   DIAGNOSTIC Colonoscopy: Small tubular adenomatous polyp removed (2020) Colonoscopy: Tubular adenomatous polyps (2015) Echocardiogram: Normal (2021)      CBC    Component Value Date/Time   WBC 6.5 05/19/2021 1103    RBC 4.29 05/19/2021 1103   HGB 12.4 05/19/2021 1103   HCT 37.3 05/19/2021 1103   PLT 252.0 05/19/2021 1103   MCV 86.9 05/19/2021 1103   MCH 29.6 11/22/2018 1453   MCHC 33.3 05/19/2021 1103   RDW 15.1 05/19/2021 1103   LYMPHSABS 1.3 05/19/2021 1103   MONOABS 0.7 05/19/2021 1103   EOSABS 0.2 05/19/2021 1103   BASOSABS 0.1 05/19/2021 1103   No results for input(s): "HGB" in the last 8760 hours.  CMP     Component Value Date/Time   NA 143 05/19/2021 1103   NA 142 03/16/2021 1346   K 3.6 05/19/2021 1103   CL 105 05/19/2021 1103   CO2 31 05/19/2021 1103   GLUCOSE 84 05/19/2021 1103   BUN 19 05/19/2021 1103   BUN 17 03/16/2021 1346   CREATININE 0.74 05/19/2021 1103   CREATININE 0.77 11/22/2018 1453   CALCIUM 9.5 05/19/2021 1103   PROT 7.3 05/19/2021 1103   ALBUMIN 4.3 05/19/2021 1103   AST 17 05/19/2021 1103   ALT 17 05/19/2021 1103   ALKPHOS 70 05/19/2021 1103   BILITOT 0.3 05/19/2021 1103   GFRNONAA 78 01/12/2021 1049   GFRAA 90 01/12/2021 1049      Latest Ref Rng & Units 05/19/2021   11:03 AM 11/22/2018    2:53 PM  Hepatic Function  Total Protein 6.0 - 8.3 g/dL 7.3  6.8   Albumin 3.5 - 5.2 g/dL 4.3    AST 0 - 37 U/L 17  21   ALT 0 - 35 U/L 17  23   Alk Phosphatase 39 - 117 U/L 70    Total Bilirubin 0.2 - 1.2 mg/dL 0.3  0.3       Current Medications:    Current Outpatient Medications (Cardiovascular):    amLODipine (NORVASC) 2.5 MG tablet, Take 1 tablet (2.5 mg total) by mouth daily.   isosorbide mononitrate (IMDUR) 30 MG 24 hr tablet, Take 1 tablet (30 mg total) by mouth daily.   rosuvastatin (CRESTOR) 10 MG tablet, Take 1 tablet (10 mg total) by mouth daily.  Current Outpatient Medications (Respiratory):    Cetirizine HCl (ALLERGY RELIEF CETIRIZINE PO), Take 1 tablet by mouth daily as needed (allergies).   montelukast (SINGULAIR) 10 MG tablet, Take 10 mg by mouth at bedtime.  Current Outpatient Medications (Analgesics):    aspirin EC 81 MG tablet, Take 1  tablet (81 mg total) by mouth daily. Swallow whole.   meloxicam (MOBIC) 15 MG tablet, Take 15 mg by mouth daily as needed for pain.   Current Outpatient Medications (Other):    cholecalciferol (VITAMIN D3) 25 MCG (1000 UT) tablet, Take 2,000 Units by mouth daily.   meclizine (ANTIVERT) 25 MG tablet, Take 25 mg by mouth 3 (three) times daily as needed for dizziness.   Multiple Vitamins-Minerals (WOMENS 50+ MULTI VITAMIN/MIN PO), Take 1 capsule by mouth daily.   Naphazoline-Pheniramine (EYE ALLERGY RELIEF OP), Apply 2 drops to eye daily as needed (watery eyes).   Omega-3 Fatty Acids (FISH OIL PO), Take 1,576 mg by mouth 3 (three) times a week.   OVER THE COUNTER MEDICATION, 2  Tumeric with pepper daily at night, 1 in the morning   vitamin C (ASCORBIC ACID) 500 MG tablet, Take 500 mg by mouth daily.   dicyclomine (BENTYL) 10 MG capsule, Take 1 capsule (10 mg total) by mouth 2 (two) times daily.  Medical History:  Past Medical History:  Diagnosis Date   Allergy    Anemia    Anxiety    situational due to death of husband    Bursitis    Cataract    forming    History of diverticulitis    with hemorrhage (recurrent)    Hx of Clostridium difficile infection    Hypercholesteremia    Hypertension    elevated since death of husband 04/25/19   Allergies: No Known Allergies   Surgical History:  She  has a past surgical history that includes Cholecystectomy; Kidney surgery; Tonsillectomy; Abdominal hysterectomy; Laproscopic sigmoid colon resection with primary anastomosis; Colonoscopy (04/30/2014); and Polypectomy. Family History:  Her family history includes CAD in her mother; Heart failure in her father.  REVIEW OF SYSTEMS  : All other systems reviewed and negative except where noted in the History of Present Illness.  PHYSICAL EXAM: BP (!) 158/80 (BP Location: Left Arm, Patient Position: Sitting, Cuff Size: Large)   Pulse 68   Ht 5' (1.524 m)   Wt 176 lb 6 oz (80 kg)   BMI 34.45  kg/m  General Appearance: Well nourished, in no apparent distress. Respiratory: Respiratory effort normal, BS equal bilaterally without rales, rhonchi, wheezing. Cardio: RRR with no MRGs. Abdomen: Soft,  Obese ,active bowel sounds. No tenderness . Without guarding and Without rebound. No masses. Rectal: Not evaluated Musculoskeletal: Full ROM, Normal gait Neuro: Alert and  oriented x4;  No focal deficits. Psych:  Cooperative. Normal mood and affect.    Doree Albee, PA-C 3:42 PM

## 2024-03-17 NOTE — Patient Instructions (Addendum)
  Add fiber like benefiber or citracel once a day Increase activity Dicyclomine try once a day and then can stop and take as needed for AB pain/spasms Try the Can do trial of IBGard which is over the counter for AB pain- Take 1-2 capsules once a day for maintence or twice a day during a flare    FODMAP stands for fermentable oligo-, di-, mono-saccharides and polyols (1). These are the scientific terms used to classify groups of carbs that are difficult for our body to digest and that are notorious for triggering digestive symptoms like bloating, gas, loose stools and stomach pain.   You can try low FODMAP diet  - start with eliminating just one column at a time that you feel may be a trigger for you. - the table at the very bottom contains foods that are low in FODMAPs   Sometimes trying to eliminate the FODMAP's from your diet is difficult or tricky, if you are stuggling with trying to do the elimination diet you can try an enzyme.  There is a food enzymes that you sprinkle in or on your food that helps break down the FODMAP. You can read more about the enzyme by going to this site: https://fodzyme.com/

## 2024-04-01 DIAGNOSIS — Z1211 Encounter for screening for malignant neoplasm of colon: Secondary | ICD-10-CM | POA: Diagnosis not present

## 2024-04-01 DIAGNOSIS — Z1212 Encounter for screening for malignant neoplasm of rectum: Secondary | ICD-10-CM | POA: Diagnosis not present

## 2024-04-06 LAB — COLOGUARD: COLOGUARD: POSITIVE — AB

## 2024-05-06 ENCOUNTER — Encounter

## 2024-05-08 ENCOUNTER — Ambulatory Visit (AMBULATORY_SURGERY_CENTER)

## 2024-05-08 VITALS — Ht 60.0 in | Wt 171.1 lb

## 2024-05-08 DIAGNOSIS — Z860100 Personal history of colon polyps, unspecified: Secondary | ICD-10-CM

## 2024-05-08 MED ORDER — NA SULFATE-K SULFATE-MG SULF 17.5-3.13-1.6 GM/177ML PO SOLN: 1.0000 | Freq: Once | ORAL | 0 refills | Status: AC

## 2024-05-08 NOTE — Progress Notes (Signed)
 No egg or soy allergy known to patient  No issues known to pt with past sedation with any surgeries or procedures Patient denies ever being told they had issues or difficulty with intubation  No FH of Malignant Hyperthermia Pt is not on diet pills Pt is not on  home 02  Pt is not on blood thinners  Pt denies issues with constipation  No A fib or A flutter Have any cardiac testing pending-- no LOA: independent  Prep: suprep   Patient's chart reviewed by Kelli Wagner CNRA prior to previsit and patient appropriate for the [facility].  Previsit completed and red dot placed by patient's name on their procedure day (on provider's schedule).     PV completed with patient. Prep instructions sent via mychart and home address.

## 2024-05-12 DIAGNOSIS — M25551 Pain in right hip: Secondary | ICD-10-CM | POA: Diagnosis not present

## 2024-05-16 ENCOUNTER — Telehealth: Payer: Self-pay | Admitting: Physician Assistant

## 2024-05-16 NOTE — Telephone Encounter (Signed)
 Returned patient call. Advised patient the one dose of NSAID that she took was fine, as well as the one serving of walnuts.  Still 4 days away for her colonoscopy. Patient verbalized understanding of holding both going forward.

## 2024-05-16 NOTE — Telephone Encounter (Signed)
 Patient called and stated that she was wanting to speak to the nurse in regards to her colonoscopy procedure. Patient stated that she just received her prep instruction and it states that 7 day before her procedure she ca not take certain medications, but patient stated that she has taken meloxicam 2 pills yesterday and she has taken 1 pill of celebrax today. Patient also stated that she also had 2 TBSP of walnuts. Patient is requesting a call back.Please advise.

## 2024-05-20 ENCOUNTER — Encounter: Payer: Self-pay | Admitting: Gastroenterology

## 2024-05-20 ENCOUNTER — Ambulatory Visit (AMBULATORY_SURGERY_CENTER): Admitting: Gastroenterology

## 2024-05-20 VITALS — BP 138/74 | HR 78 | Temp 97.3°F | Resp 18 | Ht 60.0 in | Wt 171.0 lb

## 2024-05-20 DIAGNOSIS — K640 First degree hemorrhoids: Secondary | ICD-10-CM

## 2024-05-20 DIAGNOSIS — Z860101 Personal history of adenomatous and serrated colon polyps: Secondary | ICD-10-CM

## 2024-05-20 DIAGNOSIS — Z860100 Personal history of colon polyps, unspecified: Secondary | ICD-10-CM

## 2024-05-20 DIAGNOSIS — Z1211 Encounter for screening for malignant neoplasm of colon: Secondary | ICD-10-CM | POA: Diagnosis not present

## 2024-05-20 DIAGNOSIS — Z980 Intestinal bypass and anastomosis status: Secondary | ICD-10-CM

## 2024-05-20 DIAGNOSIS — K5730 Diverticulosis of large intestine without perforation or abscess without bleeding: Secondary | ICD-10-CM | POA: Diagnosis not present

## 2024-05-20 DIAGNOSIS — E669 Obesity, unspecified: Secondary | ICD-10-CM | POA: Diagnosis not present

## 2024-05-20 DIAGNOSIS — I2510 Atherosclerotic heart disease of native coronary artery without angina pectoris: Secondary | ICD-10-CM | POA: Diagnosis not present

## 2024-05-20 MED ORDER — SODIUM CHLORIDE 0.9 % IV SOLN: 500.0000 mL | Freq: Once | INTRAVENOUS | Status: DC

## 2024-05-20 NOTE — Progress Notes (Signed)
 Expand All Collapse All         03/17/2024 Kelli Wagner [id number] [birth year]   Referring provider: Harvest Lineman, MD Primary GI doctor: Dr. Venice Gillis   ASSESSMENT AND PLAN:    Personal history of tubular adenomatous polyp June 2020 colonoscopy small TA healthy anastomosis, single diverticulum sigmoid Recall 05/2024 Her health is good but she is uncertain she would like to proceed or not, discussed risks and benefits.  Best friend is in hospice and her husband has passed, she does not have ride, we will set up reminder to call her in August/Sept to see if she is ready for colonoscopy at that sometime.    History of C. difficile colitis No diarrhea, no symptoms Discussed risk with ABX   History of complicated diverticulitis Status post sigmoid resection in 2012 No symptoms, has been on dicyclomine  twice a day for years, uncertain why she is taking it, will cut back to once a day, then stop and see if she needs it any more Given FODMAP diet and IBGard   Morbid obesity  Body mass index is 34.45 kg/m.  -Patient has been advised to make an attempt to improve diet and exercise patterns to aid in weight loss. -Recommended diet heavy in fruits and veggies and low in animal meats, cheeses, and dairy products, appropriate calorie intake   CAD nonobstructive/diastolic dysfunction Follows with Dr. Emmette Harms, no symptoms Echo 2021 grade 1 diastolic dysfunction EF 55 to 40% normal valves     Patient Care Team: Harvest Lineman, MD as PCP - General (Family Medicine) Tobb, Kardie, DO as PCP - Cardiology (Cardiology)   HISTORY OF PRESENT ILLNESS: 78 y.o. female with a past medical history listed below presents for evaluation of medication refill.    Patient last seen in the office 2022 by Alfonza Iles PA for abdominal pain and nausea had CT abdomen pelvis at that time showed diverticulosis no diverticulitis status postcholecystectomy with stable biliary ductal dilation overall negative    Discussed the use of AI scribe software for clinical note transcription with the patient, who gave verbal consent to proceed.   History of Present Illness   Kelli Wagner is a 77 year old female who presents for a medication update.   She is currently taking dicyclomine  twice daily for abdominal pain, which she has been on for a long time. She has regular bowel movements and no abdominal pain, gas, bloating, dark stools, blood in the stool, nausea, or vomiting. She is considering reducing her dicyclomine  to once daily to see if she can manage without it.   She has a history of diverticulitis, for which she underwent a resection in 2012. Her last colonoscopy was in 2020, during which a small tubular adenomatous polyp was removed. Previous polyps were removed in 2015, all benign. She is scheduled for her next colonoscopy in June 2025. There is no family history of colon cancer, and she is currently asymptomatic.   She has a past medical history of C. difficile infection, which occurred once a long time ago, and she has not experienced diarrhea since. She follows up with a cardiologist for her heart but reports no symptoms such as chest pain or shortness of breath.   Socially, her husband passed away, and she does not have a driver for her upcoming colonoscopy. She has one son who lives in [place]  and several grandchildren and great-grandchildren who live out of town. She is currently dealing with the emotional impact of her best friend being  in hospice and another friend recently losing her husband.     She  reports that she has quit smoking. She has never used smokeless tobacco. She reports that she does not currently use alcohol. She reports that she does not use drugs.   RELEVANT GI HISTORY, IMAGING AND LABS: Results   DIAGNOSTIC Colonoscopy: Small tubular adenomatous polyp removed (2020) Colonoscopy: Tubular adenomatous polyps (2015) Echocardiogram: Normal (2021)       CBC Labs  (Brief)          Component Value Date/Time    WBC 6.5 05/19/2021 1103    RBC 4.29 05/19/2021 1103    HGB 12.4 05/19/2021 1103    HCT 37.3 05/19/2021 1103    PLT 252.0 05/19/2021 1103    MCV 86.9 05/19/2021 1103    MCH 29.6 11/22/2018 1453    MCHC 33.3 05/19/2021 1103    RDW 15.1 05/19/2021 1103    LYMPHSABS 1.3 05/19/2021 1103    MONOABS 0.7 05/19/2021 1103    EOSABS 0.2 05/19/2021 1103    BASOSABS 0.1 05/19/2021 1103      Recent Labs (within last 365 days)  No results for input(s): "HGB" in the last 8760 hours.     CMP     Labs (Brief)          Component Value Date/Time    NA 143 05/19/2021 1103    NA 142 03/16/2021 1346    K 3.6 05/19/2021 1103    CL 105 05/19/2021 1103    CO2 31 05/19/2021 1103    GLUCOSE 84 05/19/2021 1103    BUN 19 05/19/2021 1103    BUN 17 03/16/2021 1346    CREATININE 0.74 05/19/2021 1103    CREATININE 0.77 11/22/2018 1453    CALCIUM  9.5 05/19/2021 1103    PROT 7.3 05/19/2021 1103    ALBUMIN 4.3 05/19/2021 1103    AST 17 05/19/2021 1103    ALT 17 05/19/2021 1103    ALKPHOS 70 05/19/2021 1103    BILITOT 0.3 05/19/2021 1103    GFRNONAA 78 01/12/2021 1049    GFRAA 90 01/12/2021 1049          Latest Ref Rng & Units 05/19/2021   11:03 AM 11/22/2018    2:53 PM  Hepatic Function  Total Protein 6.0 - 8.3 g/dL 7.3  6.8   Albumin 3.5 - 5.2 g/dL 4.3     AST 0 - 37 U/L 17  21   ALT 0 - 35 U/L 17  23   Alk Phosphatase 39 - 117 U/L 70     Total Bilirubin 0.2 - 1.2 mg/dL 0.3  0.3       Current Medications:      Current Outpatient Medications (Cardiovascular):    amLODipine  (NORVASC ) 2.5 MG tablet, Take 1 tablet (2.5 mg total) by mouth daily.   isosorbide  mononitrate (IMDUR ) 30 MG 24 hr tablet, Take 1 tablet (30 mg total) by mouth daily.   rosuvastatin  (CRESTOR ) 10 MG tablet, Take 1 tablet (10 mg total) by mouth daily.   Current Outpatient Medications (Respiratory):    Cetirizine HCl (ALLERGY RELIEF CETIRIZINE PO), Take 1 tablet by mouth  daily as needed (allergies).   montelukast (SINGULAIR) 10 MG tablet, Take 10 mg by mouth at bedtime.   Current Outpatient Medications (Analgesics):    aspirin  EC 81 MG tablet, Take 1 tablet (81 mg total) by mouth daily. Swallow whole.   meloxicam (MOBIC) 15 MG tablet, Take 15 mg by mouth daily as needed  for pain.     Current Outpatient Medications (Other):    cholecalciferol (VITAMIN D3) 25 MCG (1000 UT) tablet, Take 2,000 Units by mouth daily.   meclizine (ANTIVERT) 25 MG tablet, Take 25 mg by mouth 3 (three) times daily as needed for dizziness.   Multiple Vitamins-Minerals (WOMENS 50+ MULTI VITAMIN/MIN PO), Take 1 capsule by mouth daily.   Naphazoline-Pheniramine (EYE ALLERGY RELIEF OP), Apply 2 drops to eye daily as needed (watery eyes).   Omega-3 Fatty Acids (FISH OIL PO), Take 1,576 mg by mouth 3 (three) times a week.   OVER THE COUNTER MEDICATION, 2 Tumeric with pepper daily at night, 1 in the morning   vitamin C (ASCORBIC ACID) 500 MG tablet, Take 500 mg by mouth daily.   dicyclomine  (BENTYL ) 10 MG capsule, Take 1 capsule (10 mg total) by mouth 2 (two) times daily.   Medical History:      Past Medical History:  Diagnosis Date   Allergy     Anemia     Anxiety      situational due to death of husband    Bursitis     Cataract      forming    History of diverticulitis      with hemorrhage (recurrent)    Hx of Clostridium difficile infection     Hypercholesteremia     Hypertension      elevated since death of husband Apr 30, 2019        Allergies:  Allergies  No Known Allergies      Surgical History:  She  has a past surgical history that includes Cholecystectomy; Kidney surgery; Tonsillectomy; Abdominal hysterectomy; Laproscopic sigmoid colon resection with primary anastomosis; Colonoscopy (04/30/2014); and Polypectomy. Family History:  Her family history includes CAD in her mother; Heart failure in her father.   REVIEW OF SYSTEMS  : All other systems reviewed and  negative except where noted in the History of Present Illness.   PHYSICAL EXAM: BP (!) 158/80 (BP Location: Left Arm, Patient Position: Sitting, Cuff Size: Large)   Pulse 68   Ht 5' (1.524 m)   Wt 176 lb 6 oz (80 kg)   BMI 34.45 kg/m  General Appearance: Well nourished, in no apparent distress. Respiratory: Respiratory effort normal, BS equal bilaterally without rales, rhonchi, wheezing. Cardio: RRR with no MRGs. Abdomen: Soft,  Obese ,active bowel sounds. No tenderness . Without guarding and Without rebound. No masses. Rectal: Not evaluated Musculoskeletal: Full ROM, Normal gait Neuro: Alert and  oriented x4;  No focal deficits. Psych:  Cooperative. Normal mood and affect.      Edmonia Gottron, PA-C     Attending physician's note   I have taken history, reviewed the chart and examined the patient. I performed a substantive portion of this encounter, including complete performance of at least one of the key components, in conjunction with the APP. I agree with the Advanced Practitioner's note, impression and recommendations.    Magnus Schuller, MD [facility] [phone]

## 2024-05-20 NOTE — Progress Notes (Signed)
 Pt A/O x 3, gd SR's, pleased with anesthesia, report to RN

## 2024-05-20 NOTE — Op Note (Signed)
 [facility] Patient Name: Kelli Wagner Procedure Date: 05/20/2024 11:33 AM MRN: [id number] Endoscopist: Lajuan Pila , MD, [id number] Age: 78 Referring MD:  Date of Birth: Sep 24, 1946 Gender: Female Account #: [id number] Procedure:                Colonoscopy Indications:              High risk colon cancer surveillance: Personal                            history of colonic polyps. History of complicated                            diverticulitis s/p sigmoid resection in 2012 Medicines:                Monitored Anesthesia Care Procedure:                Pre-Anesthesia Assessment:                           - Prior to the procedure, a History and Physical                            was performed, and patient medications and                            allergies were reviewed. The patient's tolerance of                            previous anesthesia was also reviewed. The risks                            and benefits of the procedure and the sedation                            options and risks were discussed with the patient.                            All questions were answered, and informed consent                            was obtained. Prior Anticoagulants: The patient has                            taken no anticoagulant or antiplatelet agents. ASA                            Grade Assessment: II - A patient with mild systemic                            disease. After reviewing the risks and benefits,                            the patient was deemed in satisfactory condition to  undergo the procedure.                           After obtaining informed consent, the colonoscope                            was passed under direct vision. Throughout the                            procedure, the patient's blood pressure, pulse, and                            oxygen saturations were monitored continuously. The                            Olympus Scope  [id number] was introduced through the                            anus and advanced to the the cecum, identified by                            appendiceal orifice and ileocecal valve. The                            colonoscopy was performed without difficulty. The                            patient tolerated the procedure well. The quality                            of the bowel preparation was good. The ileocecal                            valve, appendiceal orifice, and rectum were                            photographed. Scope In: 11:39:26 AM Scope Out: 11:46:45 AM Scope Withdrawal Time: 0 hours 5 minutes 34 seconds  Total Procedure Duration: 0 hours 7 minutes 19 seconds  Findings:                 Rare medium-mouthed diverticula were found in the                            neo-sigmoid colon and ascending colon.                           There was evidence of a prior end-to-end                            colo-colonic anastomosis in the sigmoid colon. This                            was patent.  Non-bleeding internal hemorrhoids were found during                            retroflexion. The hemorrhoids were small and Grade                            I (internal hemorrhoids that do not prolapse).                           The exam was otherwise without abnormality on                            direct and retroflexion views. Complications:            No immediate complications. Estimated Blood Loss:     Estimated blood loss: none. Impression:               - Minimal colonic diverticulosis.                           - Patent end-to-end colo-colonic anastomosis.                           - Non-bleeding internal hemorrhoids.                           - The examination was otherwise normal on direct                            and retroflexion views.                           - No specimens collected. Recommendation:           - Patient has a contact number available  for                            emergencies. The signs and symptoms of potential                            delayed complications were discussed with the                            patient. Return to normal activities tomorrow.                            Written discharge instructions were provided to the                            patient.                           - Resume previous diet.                           - Continue present medications.                           -  Repeat colonoscopy is not recommended for                            screening purposes.                           - The findings and recommendations were discussed                            with the patient (HIPAA) Lajuan Pila, MD 05/20/2024 11:50:38 AM This report has been signed electronically.

## 2024-05-20 NOTE — Patient Instructions (Signed)

## 2024-05-20 NOTE — Progress Notes (Signed)
 rev

## 2024-05-21 ENCOUNTER — Telehealth: Payer: Self-pay

## 2024-05-21 DIAGNOSIS — M25551 Pain in right hip: Secondary | ICD-10-CM | POA: Diagnosis not present

## 2024-05-21 DIAGNOSIS — R208 Other disturbances of skin sensation: Secondary | ICD-10-CM | POA: Diagnosis not present

## 2024-05-21 NOTE — Telephone Encounter (Signed)
  Follow up Call-     05/20/2024   10:47 AM  Call back number  Post procedure Call Back phone  # [phone]  Permission to leave phone message Yes     Patient questions:  Do you have a fever, pain , or abdominal swelling? No. Pain Score  0 *  Have you tolerated food without any problems? Yes.    Have you been able to return to your normal activities? Yes.    Do you have any questions about your discharge instructions: Diet   No. Medications  No. Follow up visit  No.  Do you have questions or concerns about your Care? No.  Actions: * If pain score is 4 or above: No action needed, pain <4.

## 2024-05-30 DIAGNOSIS — M5416 Radiculopathy, lumbar region: Secondary | ICD-10-CM | POA: Diagnosis not present

## 2024-05-30 DIAGNOSIS — Z6831 Body mass index (BMI) 31.0-31.9, adult: Secondary | ICD-10-CM | POA: Diagnosis not present

## 2024-05-30 DIAGNOSIS — R208 Other disturbances of skin sensation: Secondary | ICD-10-CM | POA: Diagnosis not present

## 2024-05-30 DIAGNOSIS — M25551 Pain in right hip: Secondary | ICD-10-CM | POA: Diagnosis not present

## 2024-06-06 DIAGNOSIS — M48061 Spinal stenosis, lumbar region without neurogenic claudication: Secondary | ICD-10-CM | POA: Diagnosis not present

## 2024-06-06 DIAGNOSIS — M4726 Other spondylosis with radiculopathy, lumbar region: Secondary | ICD-10-CM | POA: Diagnosis not present

## 2024-06-09 ENCOUNTER — Telehealth: Payer: Self-pay | Admitting: Gastroenterology

## 2024-06-09 DIAGNOSIS — M5416 Radiculopathy, lumbar region: Secondary | ICD-10-CM | POA: Diagnosis not present

## 2024-06-09 DIAGNOSIS — Z6831 Body mass index (BMI) 31.0-31.9, adult: Secondary | ICD-10-CM | POA: Diagnosis not present

## 2024-06-09 DIAGNOSIS — M25551 Pain in right hip: Secondary | ICD-10-CM | POA: Diagnosis not present

## 2024-06-09 DIAGNOSIS — R208 Other disturbances of skin sensation: Secondary | ICD-10-CM | POA: Diagnosis not present

## 2024-06-09 MED ORDER — DICYCLOMINE HCL 10 MG PO CAPS: 10.0000 mg | ORAL_CAPSULE | Freq: Two times a day (BID) | ORAL | 2 refills | Status: AC

## 2024-06-09 NOTE — Telephone Encounter (Signed)
 Requesting medication refill for dicyclomine  sent into meds by mail. Please advise.   Thank you

## 2024-06-09 NOTE — Telephone Encounter (Signed)
 Done

## 2024-07-08 DIAGNOSIS — M48061 Spinal stenosis, lumbar region without neurogenic claudication: Secondary | ICD-10-CM | POA: Diagnosis not present

## 2024-07-22 DIAGNOSIS — Z131 Encounter for screening for diabetes mellitus: Secondary | ICD-10-CM | POA: Diagnosis not present

## 2024-07-22 DIAGNOSIS — Z6831 Body mass index (BMI) 31.0-31.9, adult: Secondary | ICD-10-CM | POA: Diagnosis not present

## 2024-07-22 DIAGNOSIS — Z79899 Other long term (current) drug therapy: Secondary | ICD-10-CM | POA: Diagnosis not present

## 2024-07-22 DIAGNOSIS — Z1339 Encounter for screening examination for other mental health and behavioral disorders: Secondary | ICD-10-CM | POA: Diagnosis not present

## 2024-07-22 DIAGNOSIS — Z0000 Encounter for general adult medical examination without abnormal findings: Secondary | ICD-10-CM | POA: Diagnosis not present

## 2024-07-22 DIAGNOSIS — Z1331 Encounter for screening for depression: Secondary | ICD-10-CM | POA: Diagnosis not present

## 2024-07-22 DIAGNOSIS — E782 Mixed hyperlipidemia: Secondary | ICD-10-CM | POA: Diagnosis not present

## 2024-07-22 DIAGNOSIS — R7303 Prediabetes: Secondary | ICD-10-CM | POA: Diagnosis not present

## 2024-07-22 DIAGNOSIS — I10 Essential (primary) hypertension: Secondary | ICD-10-CM | POA: Diagnosis not present

## 2024-08-12 DIAGNOSIS — M5416 Radiculopathy, lumbar region: Secondary | ICD-10-CM | POA: Diagnosis not present

## 2024-08-22 ENCOUNTER — Encounter: Payer: Self-pay | Admitting: Cardiology

## 2024-09-02 DIAGNOSIS — M4726 Other spondylosis with radiculopathy, lumbar region: Secondary | ICD-10-CM | POA: Diagnosis not present

## 2024-09-24 DIAGNOSIS — Z23 Encounter for immunization: Secondary | ICD-10-CM | POA: Diagnosis not present

## 2024-10-07 DIAGNOSIS — I10 Essential (primary) hypertension: Secondary | ICD-10-CM | POA: Diagnosis not present

## 2024-10-07 DIAGNOSIS — E782 Mixed hyperlipidemia: Secondary | ICD-10-CM | POA: Diagnosis not present

## 2024-10-07 DIAGNOSIS — M79642 Pain in left hand: Secondary | ICD-10-CM | POA: Diagnosis not present

## 2024-10-07 DIAGNOSIS — M152 Bouchard's nodes (with arthropathy): Secondary | ICD-10-CM | POA: Diagnosis not present

## 2024-10-07 DIAGNOSIS — M79641 Pain in right hand: Secondary | ICD-10-CM | POA: Diagnosis not present

## 2024-10-20 DIAGNOSIS — E559 Vitamin D deficiency, unspecified: Secondary | ICD-10-CM | POA: Diagnosis not present

## 2024-11-25 ENCOUNTER — Telehealth: Payer: Self-pay | Admitting: Cardiology

## 2024-11-25 ENCOUNTER — Other Ambulatory Visit: Payer: Self-pay

## 2024-11-25 MED ORDER — ISOSORBIDE MONONITRATE ER 30 MG PO TB24: 30.0000 mg | ORAL_TABLET | Freq: Every day | ORAL | 0 refills | Status: AC

## 2024-11-25 NOTE — Telephone Encounter (Signed)
°*  STAT* If patient is at the pharmacy, call can be transferred to refill team.   1. Which medications need to be refilled? (please list name of each medication and dose if known)   isosorbide  mononitrate (IMDUR ) 30 MG 24 hr tablet   2. Would you like to learn more about the convenience, safety, & potential cost savings by using the [facility]?   3. Are you open to using the [facility] (Type [facility]. ).  4. Which pharmacy/location (including street and city if local pharmacy) is medication to be sent to?  [facility] - [place], [place] - [address]   5. Do they need a 30 day or 90 day supply?   90 day  Patient stated she has about a week left of this medication.   Patient has appointment scheduled with Dr. Sheena on 3/10.

## 2025-02-17 ENCOUNTER — Ambulatory Visit: Admitting: Cardiology
